# Patient Record
Sex: Female | Born: 1940
Health system: Southern US, Community
[De-identification: ages and names within clinical notes are randomized; demographics above are authoritative.]

## PROBLEM LIST (undated history)

## (undated) DIAGNOSIS — E785 Hyperlipidemia, unspecified: Secondary | ICD-10-CM

## (undated) DIAGNOSIS — K219 Gastro-esophageal reflux disease without esophagitis: Secondary | ICD-10-CM

## (undated) DIAGNOSIS — K449 Diaphragmatic hernia without obstruction or gangrene: Secondary | ICD-10-CM

## (undated) DIAGNOSIS — D869 Sarcoidosis, unspecified: Secondary | ICD-10-CM

## (undated) DIAGNOSIS — I10 Essential (primary) hypertension: Secondary | ICD-10-CM

## (undated) HISTORY — PX: PARTIAL HYSTERECTOMY: SHX80

## (undated) HISTORY — PX: CYST REMOVAL TRUNK: SHX6283

## (undated) HISTORY — DX: Diaphragmatic hernia without obstruction or gangrene: K44.9

## (undated) HISTORY — DX: Hyperlipidemia, unspecified: E78.5

## (undated) HISTORY — DX: Gastro-esophageal reflux disease without esophagitis: K21.9

## (undated) HISTORY — DX: Sarcoidosis, unspecified: D86.9

## (undated) HISTORY — DX: Essential (primary) hypertension: I10

---

## 2013-09-16 DIAGNOSIS — E785 Hyperlipidemia, unspecified: Secondary | ICD-10-CM | POA: Insufficient documentation

## 2014-03-29 ENCOUNTER — Other Ambulatory Visit (HOSPITAL_COMMUNITY): Payer: Self-pay | Admitting: "Endocrinology

## 2014-03-29 DIAGNOSIS — E041 Nontoxic single thyroid nodule: Secondary | ICD-10-CM

## 2014-04-06 ENCOUNTER — Other Ambulatory Visit (HOSPITAL_COMMUNITY): Payer: Self-pay | Admitting: "Endocrinology

## 2014-04-06 ENCOUNTER — Ambulatory Visit (HOSPITAL_COMMUNITY)
Admission: RE | Admit: 2014-04-06 | Discharge: 2014-04-06 | Disposition: A | Discharge status: Home / Self Care | Payer: Medicare Other | Source: Ambulatory Visit | Attending: "Endocrinology | Admitting: "Endocrinology

## 2014-04-06 ENCOUNTER — Encounter (HOSPITAL_COMMUNITY): Payer: Self-pay

## 2014-04-06 DIAGNOSIS — E049 Nontoxic goiter, unspecified: Secondary | ICD-10-CM | POA: Insufficient documentation

## 2014-04-06 DIAGNOSIS — E0590 Thyrotoxicosis, unspecified without thyrotoxic crisis or storm: Secondary | ICD-10-CM | POA: Insufficient documentation

## 2014-04-06 DIAGNOSIS — E041 Nontoxic single thyroid nodule: Secondary | ICD-10-CM | POA: Insufficient documentation

## 2014-04-06 MED ORDER — LIDOCAINE HCL (PF) 2 % IJ SOLN
INTRAMUSCULAR | Status: AC
  Filled 2014-04-06: qty 10

## 2014-04-06 NOTE — Discharge Instructions (Signed)
°Thyroid Biopsy °The thyroid gland is a butterfly-shaped gland situated in the front of the neck. It produces hormones which affect metabolism, growth and development, and body temperature. A thyroid biopsy is a procedure in which small samples of tissue or fluid are removed from the thyroid gland or mass and examined under a microscope. This test is done to determine the cause of thyroid problems, such as infection, cancer, or other thyroid problems. °There are 2 ways to obtain samples: °1. Fine needle biopsy. Samples are removed using a thin needle inserted through the skin and into the thyroid gland or mass. °2. Open biopsy. Samples are removed after a cut (incision) is made through the skin. °LET YOUR CAREGIVER KNOW ABOUT:  °· Allergies. °· Medications taken including herbs, eye drops, over-the-counter medications, and creams. °· Use of steroids (by mouth or creams). °· Previous problems with anesthetics or numbing medicine. °· Possibility of pregnancy, if this applies. °· History of blood clots (thrombophlebitis). °· History of bleeding or blood problems. °· Previous surgery. °· Other health problems. °RISKS AND COMPLICATIONS °· Bleeding from the site. The risk of bleeding is higher if you have a bleeding disorder or are taking any blood thinning medications (anticoagulants). °· Infection. °· Injury to structures near the thyroid gland. °BEFORE THE PROCEDURE  °This is a procedure that can be done as an outpatient. Confirm the time that you need to arrive for your procedure. Confirm whether there is a need to fast or withhold any medications. A blood sample may be done to determine your blood clotting time. Medicine may be given to help you relax (sedative). °PROCEDURE °Fine needle biopsy. °You will be awake during the procedure. You may be asked to lie on your back with your head tipped backward to extend your neck. Let your caregiver know if you cannot tolerate the positioning. An area on your neck will be  cleansed. A needle is inserted through the skin of your neck. You may feel a mild discomfort during this procedure. You may be asked to avoid coughing, talking, swallowing, or making sounds during some portions of the procedure. The needle is withdrawn once tissue or fluid samples have been removed. Pressure may be applied to the neck to reduce swelling and ensure that bleeding has stopped. The samples will be sent for examination.  °Open biopsy. °You will be given general anesthesia. You will be asleep during the procedure. An incision is made in your neck. A sample of thyroid tissue or the mass is removed. The tissue sample or mass will be sent for examination. The sample or mass may be examined during the biopsy. If the sample or mass contains cancer cells, some or all of the thyroid gland may be removed. The incision is closed with stitches. °AFTER THE PROCEDURE  °Your recovery will be assessed and monitored. If there are no problems, as an outpatient, you should be able to go home shortly after the procedure. °If you had a fine needle biopsy: °· You may have soreness at the biopsy site for 1 to 2 days. °If you had an open biopsy:  °· You may have soreness at the biopsy site for 3 to 4 days. °· You may have a hoarse voice or sore throat for 1 to 2 days. °Obtaining the Test Results °It is your responsibility to obtain your test results. Do not assume everything is normal if you have not heard from your caregiver or the medical facility. It is important for you to follow up   on all of your test results. °HOME CARE INSTRUCTIONS  °· Keeping your head raised on a pillow when you are lying down may ease biopsy site discomfort. °· Supporting the back of your head and neck with both hands as you sit up from a lying position may ease biopsy site discomfort. °· Only take over-the-counter or prescription medicines for pain, discomfort, or fever as directed by your caregiver. °· Throat lozenges or gargling with warm salt  water may help to soothe a sore throat. °SEEK IMMEDIATE MEDICAL CARE IF:  °· You have severe bleeding from the biopsy site. °· You have difficulty swallowing. °· You have a fever. °· You have increased pain, swelling, redness, or warmth at the biopsy site. °· You notice pus coming from the biopsy site. °· You have swollen glands (lymph nodes) in your neck. °Document Released: 05/11/2007 Document Revised: 11/08/2012 Document Reviewed: 10/06/2013 °ExitCare® Patient Information ©2015 [employer]. This information is not intended to replace advice given to you by your health care provider. Make sure you discuss any questions you have with your health care provider. ° °Thyroid Biopsy °The thyroid gland is a butterfly-shaped gland situated in the front of the neck. It produces hormones which affect metabolism, growth and development, and body temperature. A thyroid biopsy is a procedure in which small samples of tissue or fluid are removed from the thyroid gland or mass and examined under a microscope. This test is done to determine the cause of thyroid problems, such as infection, cancer, or other thyroid problems. °There are 2 ways to obtain samples: °3. Fine needle biopsy. Samples are removed using a thin needle inserted through the skin and into the thyroid gland or mass. °4. Open biopsy. Samples are removed after a cut (incision) is made through the skin. °LET YOUR CAREGIVER KNOW ABOUT:  °· Allergies. °· Medications taken including herbs, eye drops, over-the-counter medications, and creams. °· Use of steroids (by mouth or creams). °· Previous problems with anesthetics or numbing medicine. °· Possibility of pregnancy, if this applies. °· History of blood clots (thrombophlebitis). °· History of bleeding or blood problems. °· Previous surgery. °· Other health problems. °RISKS AND COMPLICATIONS °· Bleeding from the site. The risk of bleeding is higher if you have a bleeding disorder or are taking any blood thinning  medications (anticoagulants). °· Infection. °· Injury to structures near the thyroid gland. °BEFORE THE PROCEDURE  °This is a procedure that can be done as an outpatient. Confirm the time that you need to arrive for your procedure. Confirm whether there is a need to fast or withhold any medications. A blood sample may be done to determine your blood clotting time. Medicine may be given to help you relax (sedative). °PROCEDURE °Fine needle biopsy. °You will be awake during the procedure. You may be asked to lie on your back with your head tipped backward to extend your neck. Let your caregiver know if you cannot tolerate the positioning. An area on your neck will be cleansed. A needle is inserted through the skin of your neck. You may feel a mild discomfort during this procedure. You may be asked to avoid coughing, talking, swallowing, or making sounds during some portions of the procedure. The needle is withdrawn once tissue or fluid samples have been removed. Pressure may be applied to the neck to reduce swelling and ensure that bleeding has stopped. The samples will be sent for examination.  °Open biopsy. °You will be given general anesthesia. You will be asleep during   the procedure. An incision is made in your neck. A sample of thyroid tissue or the mass is removed. The tissue sample or mass will be sent for examination. The sample or mass may be examined during the biopsy. If the sample or mass contains cancer cells, some or all of the thyroid gland may be removed. The incision is closed with stitches. °AFTER THE PROCEDURE  °Your recovery will be assessed and monitored. If there are no problems, as an outpatient, you should be able to go home shortly after the procedure. °If you had a fine needle biopsy: °· You may have soreness at the biopsy site for 1 to 2 days. °If you had an open biopsy:  °· You may have soreness at the biopsy site for 3 to 4 days. °· You may have a hoarse voice or sore throat for 1 to 2  days. °Obtaining the Test Results °It is your responsibility to obtain your test results. Do not assume everything is normal if you have not heard from your caregiver or the medical facility. It is important for you to follow up on all of your test results. °HOME CARE INSTRUCTIONS  °· Keeping your head raised on a pillow when you are lying down may ease biopsy site discomfort. °· Supporting the back of your head and neck with both hands as you sit up from a lying position may ease biopsy site discomfort. °· Only take over-the-counter or prescription medicines for pain, discomfort, or fever as directed by your caregiver. °· Throat lozenges or gargling with warm salt water may help to soothe a sore throat. °SEEK IMMEDIATE MEDICAL CARE IF:  °· You have severe bleeding from the biopsy site. °· You have difficulty swallowing. °· You have a fever. °· You have increased pain, swelling, redness, or warmth at the biopsy site. °· You notice pus coming from the biopsy site. °· You have swollen glands (lymph nodes) in your neck. °Document Released: 05/11/2007 Document Revised: 11/08/2012 Document Reviewed: 10/06/2013 °ExitCare® Patient Information ©2015 [employer]. This information is not intended to replace advice given to you by your health care provider. Make sure you discuss any questions you have with your health care provider. ° °

## 2014-05-29 ENCOUNTER — Other Ambulatory Visit (HOSPITAL_COMMUNITY): Payer: Self-pay | Admitting: "Endocrinology

## 2014-05-29 DIAGNOSIS — E042 Nontoxic multinodular goiter: Secondary | ICD-10-CM

## 2014-06-26 ENCOUNTER — Ambulatory Visit (HOSPITAL_COMMUNITY): Payer: Medicare Other

## 2014-08-02 DIAGNOSIS — K219 Gastro-esophageal reflux disease without esophagitis: Secondary | ICD-10-CM | POA: Diagnosis not present

## 2014-08-02 DIAGNOSIS — E782 Mixed hyperlipidemia: Secondary | ICD-10-CM | POA: Diagnosis not present

## 2014-08-02 DIAGNOSIS — E039 Hypothyroidism, unspecified: Secondary | ICD-10-CM | POA: Diagnosis not present

## 2014-08-02 DIAGNOSIS — I10 Essential (primary) hypertension: Secondary | ICD-10-CM | POA: Diagnosis not present

## 2014-08-09 DIAGNOSIS — E041 Nontoxic single thyroid nodule: Secondary | ICD-10-CM | POA: Diagnosis not present

## 2014-08-09 DIAGNOSIS — G4733 Obstructive sleep apnea (adult) (pediatric): Secondary | ICD-10-CM | POA: Diagnosis not present

## 2014-08-09 DIAGNOSIS — Z1389 Encounter for screening for other disorder: Secondary | ICD-10-CM | POA: Diagnosis not present

## 2014-08-09 DIAGNOSIS — E0580 Other thyrotoxicosis without thyrotoxic crisis or storm: Secondary | ICD-10-CM | POA: Diagnosis not present

## 2014-08-09 DIAGNOSIS — E782 Mixed hyperlipidemia: Secondary | ICD-10-CM | POA: Diagnosis not present

## 2014-08-09 DIAGNOSIS — Z23 Encounter for immunization: Secondary | ICD-10-CM | POA: Diagnosis not present

## 2014-11-20 DIAGNOSIS — E041 Nontoxic single thyroid nodule: Secondary | ICD-10-CM | POA: Diagnosis not present

## 2014-11-20 DIAGNOSIS — E0580 Other thyrotoxicosis without thyrotoxic crisis or storm: Secondary | ICD-10-CM | POA: Diagnosis not present

## 2014-11-20 DIAGNOSIS — E039 Hypothyroidism, unspecified: Secondary | ICD-10-CM | POA: Diagnosis not present

## 2014-11-20 DIAGNOSIS — E782 Mixed hyperlipidemia: Secondary | ICD-10-CM | POA: Diagnosis not present

## 2014-11-20 DIAGNOSIS — E1165 Type 2 diabetes mellitus with hyperglycemia: Secondary | ICD-10-CM | POA: Diagnosis not present

## 2014-11-23 DIAGNOSIS — E0580 Other thyrotoxicosis without thyrotoxic crisis or storm: Secondary | ICD-10-CM | POA: Diagnosis not present

## 2014-11-23 DIAGNOSIS — E039 Hypothyroidism, unspecified: Secondary | ICD-10-CM | POA: Diagnosis not present

## 2014-11-23 DIAGNOSIS — I10 Essential (primary) hypertension: Secondary | ICD-10-CM | POA: Diagnosis not present

## 2014-11-23 DIAGNOSIS — E041 Nontoxic single thyroid nodule: Secondary | ICD-10-CM | POA: Diagnosis not present

## 2014-11-23 DIAGNOSIS — E1165 Type 2 diabetes mellitus with hyperglycemia: Secondary | ICD-10-CM | POA: Diagnosis not present

## 2015-02-05 DIAGNOSIS — E1165 Type 2 diabetes mellitus with hyperglycemia: Secondary | ICD-10-CM | POA: Diagnosis not present

## 2015-02-05 DIAGNOSIS — E039 Hypothyroidism, unspecified: Secondary | ICD-10-CM | POA: Diagnosis not present

## 2015-02-05 DIAGNOSIS — E0580 Other thyrotoxicosis without thyrotoxic crisis or storm: Secondary | ICD-10-CM | POA: Diagnosis not present

## 2015-02-05 DIAGNOSIS — E041 Nontoxic single thyroid nodule: Secondary | ICD-10-CM | POA: Diagnosis not present

## 2015-02-05 DIAGNOSIS — E782 Mixed hyperlipidemia: Secondary | ICD-10-CM | POA: Diagnosis not present

## 2015-02-12 DIAGNOSIS — E041 Nontoxic single thyroid nodule: Secondary | ICD-10-CM | POA: Diagnosis not present

## 2015-02-12 DIAGNOSIS — I10 Essential (primary) hypertension: Secondary | ICD-10-CM | POA: Diagnosis not present

## 2015-02-12 DIAGNOSIS — E1165 Type 2 diabetes mellitus with hyperglycemia: Secondary | ICD-10-CM | POA: Diagnosis not present

## 2015-02-12 DIAGNOSIS — Z1389 Encounter for screening for other disorder: Secondary | ICD-10-CM | POA: Diagnosis not present

## 2015-05-25 DIAGNOSIS — Z23 Encounter for immunization: Secondary | ICD-10-CM | POA: Diagnosis not present

## 2015-05-25 DIAGNOSIS — M7552 Bursitis of left shoulder: Secondary | ICD-10-CM | POA: Diagnosis not present

## 2015-05-25 DIAGNOSIS — M7502 Adhesive capsulitis of left shoulder: Secondary | ICD-10-CM | POA: Diagnosis not present

## 2015-07-26 DIAGNOSIS — E039 Hypothyroidism, unspecified: Secondary | ICD-10-CM | POA: Diagnosis not present

## 2015-07-26 DIAGNOSIS — E1165 Type 2 diabetes mellitus with hyperglycemia: Secondary | ICD-10-CM | POA: Diagnosis not present

## 2015-07-26 DIAGNOSIS — E782 Mixed hyperlipidemia: Secondary | ICD-10-CM | POA: Diagnosis not present

## 2015-07-26 DIAGNOSIS — I10 Essential (primary) hypertension: Secondary | ICD-10-CM | POA: Diagnosis not present

## 2015-07-26 DIAGNOSIS — E0580 Other thyrotoxicosis without thyrotoxic crisis or storm: Secondary | ICD-10-CM | POA: Diagnosis not present

## 2015-08-02 DIAGNOSIS — E039 Hypothyroidism, unspecified: Secondary | ICD-10-CM | POA: Diagnosis not present

## 2015-08-02 DIAGNOSIS — G4733 Obstructive sleep apnea (adult) (pediatric): Secondary | ICD-10-CM | POA: Diagnosis not present

## 2015-08-02 DIAGNOSIS — E1165 Type 2 diabetes mellitus with hyperglycemia: Secondary | ICD-10-CM | POA: Diagnosis not present

## 2015-08-02 DIAGNOSIS — I10 Essential (primary) hypertension: Secondary | ICD-10-CM | POA: Diagnosis not present

## 2015-08-02 DIAGNOSIS — Z0001 Encounter for general adult medical examination with abnormal findings: Secondary | ICD-10-CM | POA: Diagnosis not present

## 2015-08-02 DIAGNOSIS — J439 Emphysema, unspecified: Secondary | ICD-10-CM | POA: Diagnosis not present

## 2015-08-02 DIAGNOSIS — E041 Nontoxic single thyroid nodule: Secondary | ICD-10-CM | POA: Diagnosis not present

## 2015-08-02 DIAGNOSIS — M545 Low back pain: Secondary | ICD-10-CM | POA: Diagnosis not present

## 2015-08-02 DIAGNOSIS — K219 Gastro-esophageal reflux disease without esophagitis: Secondary | ICD-10-CM | POA: Diagnosis not present

## 2015-09-13 DIAGNOSIS — Z0100 Encounter for examination of eyes and vision without abnormal findings: Secondary | ICD-10-CM | POA: Diagnosis not present

## 2015-09-13 DIAGNOSIS — Z961 Presence of intraocular lens: Secondary | ICD-10-CM | POA: Diagnosis not present

## 2015-09-13 DIAGNOSIS — H524 Presbyopia: Secondary | ICD-10-CM | POA: Diagnosis not present

## 2015-09-13 DIAGNOSIS — H5210 Myopia, unspecified eye: Secondary | ICD-10-CM | POA: Diagnosis not present

## 2016-03-25 DIAGNOSIS — Z6836 Body mass index (BMI) 36.0-36.9, adult: Secondary | ICD-10-CM | POA: Diagnosis not present

## 2016-03-25 DIAGNOSIS — E782 Mixed hyperlipidemia: Secondary | ICD-10-CM | POA: Diagnosis not present

## 2016-03-25 DIAGNOSIS — E039 Hypothyroidism, unspecified: Secondary | ICD-10-CM | POA: Diagnosis not present

## 2016-03-25 DIAGNOSIS — M62838 Other muscle spasm: Secondary | ICD-10-CM | POA: Diagnosis not present

## 2016-03-25 DIAGNOSIS — E1165 Type 2 diabetes mellitus with hyperglycemia: Secondary | ICD-10-CM | POA: Diagnosis not present

## 2016-05-28 DIAGNOSIS — G2581 Restless legs syndrome: Secondary | ICD-10-CM | POA: Diagnosis not present

## 2016-05-28 DIAGNOSIS — Z6836 Body mass index (BMI) 36.0-36.9, adult: Secondary | ICD-10-CM | POA: Diagnosis not present

## 2016-05-28 DIAGNOSIS — M609 Myositis, unspecified: Secondary | ICD-10-CM | POA: Diagnosis not present

## 2016-05-28 DIAGNOSIS — Z23 Encounter for immunization: Secondary | ICD-10-CM | POA: Diagnosis not present

## 2016-07-01 DIAGNOSIS — R300 Dysuria: Secondary | ICD-10-CM | POA: Diagnosis not present

## 2016-07-01 DIAGNOSIS — B373 Candidiasis of vulva and vagina: Secondary | ICD-10-CM | POA: Diagnosis not present

## 2016-07-01 DIAGNOSIS — M609 Myositis, unspecified: Secondary | ICD-10-CM | POA: Diagnosis not present

## 2016-07-01 DIAGNOSIS — Z6837 Body mass index (BMI) 37.0-37.9, adult: Secondary | ICD-10-CM | POA: Diagnosis not present

## 2016-08-18 DIAGNOSIS — D869 Sarcoidosis, unspecified: Secondary | ICD-10-CM | POA: Diagnosis not present

## 2016-08-18 DIAGNOSIS — M7552 Bursitis of left shoulder: Secondary | ICD-10-CM | POA: Diagnosis not present

## 2016-08-18 DIAGNOSIS — E041 Nontoxic single thyroid nodule: Secondary | ICD-10-CM | POA: Diagnosis not present

## 2016-08-18 DIAGNOSIS — E039 Hypothyroidism, unspecified: Secondary | ICD-10-CM | POA: Diagnosis not present

## 2016-08-18 DIAGNOSIS — M545 Low back pain: Secondary | ICD-10-CM | POA: Diagnosis not present

## 2016-08-18 DIAGNOSIS — M7502 Adhesive capsulitis of left shoulder: Secondary | ICD-10-CM | POA: Diagnosis not present

## 2016-08-18 DIAGNOSIS — D8686 Sarcoid arthropathy: Secondary | ICD-10-CM | POA: Diagnosis not present

## 2016-08-18 DIAGNOSIS — G2581 Restless legs syndrome: Secondary | ICD-10-CM | POA: Diagnosis not present

## 2016-08-18 DIAGNOSIS — E1165 Type 2 diabetes mellitus with hyperglycemia: Secondary | ICD-10-CM | POA: Diagnosis not present

## 2016-08-18 DIAGNOSIS — M609 Myositis, unspecified: Secondary | ICD-10-CM | POA: Diagnosis not present

## 2016-08-21 DIAGNOSIS — K449 Diaphragmatic hernia without obstruction or gangrene: Secondary | ICD-10-CM | POA: Diagnosis not present

## 2016-08-21 DIAGNOSIS — D7389 Other diseases of spleen: Secondary | ICD-10-CM | POA: Diagnosis not present

## 2016-08-21 DIAGNOSIS — J9811 Atelectasis: Secondary | ICD-10-CM | POA: Diagnosis not present

## 2016-08-21 DIAGNOSIS — D869 Sarcoidosis, unspecified: Secondary | ICD-10-CM | POA: Diagnosis not present

## 2016-08-21 DIAGNOSIS — E042 Nontoxic multinodular goiter: Secondary | ICD-10-CM | POA: Diagnosis not present

## 2016-08-21 DIAGNOSIS — R609 Edema, unspecified: Secondary | ICD-10-CM | POA: Diagnosis not present

## 2016-08-25 DIAGNOSIS — R0602 Shortness of breath: Secondary | ICD-10-CM | POA: Diagnosis not present

## 2016-08-25 DIAGNOSIS — D869 Sarcoidosis, unspecified: Secondary | ICD-10-CM | POA: Diagnosis not present

## 2016-08-25 DIAGNOSIS — E042 Nontoxic multinodular goiter: Secondary | ICD-10-CM | POA: Diagnosis not present

## 2016-08-25 DIAGNOSIS — R609 Edema, unspecified: Secondary | ICD-10-CM | POA: Diagnosis not present

## 2016-08-28 DIAGNOSIS — M25512 Pain in left shoulder: Secondary | ICD-10-CM | POA: Diagnosis not present

## 2016-08-28 DIAGNOSIS — M7552 Bursitis of left shoulder: Secondary | ICD-10-CM | POA: Diagnosis not present

## 2016-09-01 DIAGNOSIS — M25512 Pain in left shoulder: Secondary | ICD-10-CM | POA: Diagnosis not present

## 2016-09-01 DIAGNOSIS — M7552 Bursitis of left shoulder: Secondary | ICD-10-CM | POA: Diagnosis not present

## 2016-09-03 DIAGNOSIS — M25512 Pain in left shoulder: Secondary | ICD-10-CM | POA: Diagnosis not present

## 2016-09-03 DIAGNOSIS — M7552 Bursitis of left shoulder: Secondary | ICD-10-CM | POA: Diagnosis not present

## 2017-04-14 DIAGNOSIS — H1033 Unspecified acute conjunctivitis, bilateral: Secondary | ICD-10-CM | POA: Diagnosis not present

## 2017-04-22 DIAGNOSIS — H11423 Conjunctival edema, bilateral: Secondary | ICD-10-CM | POA: Diagnosis not present

## 2017-04-22 DIAGNOSIS — H1033 Unspecified acute conjunctivitis, bilateral: Secondary | ICD-10-CM | POA: Diagnosis not present

## 2017-04-22 DIAGNOSIS — H5201 Hypermetropia, right eye: Secondary | ICD-10-CM | POA: Diagnosis not present

## 2017-04-22 DIAGNOSIS — H52223 Regular astigmatism, bilateral: Secondary | ICD-10-CM | POA: Diagnosis not present

## 2017-04-22 DIAGNOSIS — H5212 Myopia, left eye: Secondary | ICD-10-CM | POA: Diagnosis not present

## 2017-05-21 DIAGNOSIS — J9622 Acute and chronic respiratory failure with hypercapnia: Secondary | ICD-10-CM | POA: Diagnosis not present

## 2017-05-21 DIAGNOSIS — K449 Diaphragmatic hernia without obstruction or gangrene: Secondary | ICD-10-CM | POA: Diagnosis not present

## 2017-05-21 DIAGNOSIS — Z23 Encounter for immunization: Secondary | ICD-10-CM | POA: Diagnosis not present

## 2017-05-21 DIAGNOSIS — D869 Sarcoidosis, unspecified: Secondary | ICD-10-CM | POA: Diagnosis not present

## 2017-05-21 DIAGNOSIS — I272 Pulmonary hypertension, unspecified: Secondary | ICD-10-CM | POA: Diagnosis not present

## 2017-05-21 DIAGNOSIS — Z6836 Body mass index (BMI) 36.0-36.9, adult: Secondary | ICD-10-CM | POA: Diagnosis not present

## 2017-05-21 DIAGNOSIS — Z6837 Body mass index (BMI) 37.0-37.9, adult: Secondary | ICD-10-CM | POA: Diagnosis not present

## 2017-05-21 DIAGNOSIS — I313 Pericardial effusion (noninflammatory): Secondary | ICD-10-CM | POA: Diagnosis not present

## 2017-05-21 DIAGNOSIS — R0902 Hypoxemia: Secondary | ICD-10-CM | POA: Diagnosis not present

## 2017-05-21 DIAGNOSIS — R069 Unspecified abnormalities of breathing: Secondary | ICD-10-CM | POA: Diagnosis not present

## 2017-05-21 DIAGNOSIS — I10 Essential (primary) hypertension: Secondary | ICD-10-CM | POA: Diagnosis not present

## 2017-05-21 DIAGNOSIS — J9601 Acute respiratory failure with hypoxia: Secondary | ICD-10-CM | POA: Diagnosis not present

## 2017-05-21 DIAGNOSIS — E119 Type 2 diabetes mellitus without complications: Secondary | ICD-10-CM | POA: Diagnosis not present

## 2017-05-21 DIAGNOSIS — D860 Sarcoidosis of lung: Secondary | ICD-10-CM | POA: Diagnosis not present

## 2017-05-21 DIAGNOSIS — R0602 Shortness of breath: Secondary | ICD-10-CM | POA: Diagnosis not present

## 2017-05-25 DIAGNOSIS — Z23 Encounter for immunization: Secondary | ICD-10-CM | POA: Diagnosis not present

## 2017-05-26 DIAGNOSIS — K449 Diaphragmatic hernia without obstruction or gangrene: Secondary | ICD-10-CM | POA: Diagnosis not present

## 2017-05-26 DIAGNOSIS — E041 Nontoxic single thyroid nodule: Secondary | ICD-10-CM | POA: Diagnosis not present

## 2017-05-26 DIAGNOSIS — Z9981 Dependence on supplemental oxygen: Secondary | ICD-10-CM | POA: Diagnosis not present

## 2017-05-26 DIAGNOSIS — J9622 Acute and chronic respiratory failure with hypercapnia: Secondary | ICD-10-CM | POA: Diagnosis not present

## 2017-05-26 DIAGNOSIS — E119 Type 2 diabetes mellitus without complications: Secondary | ICD-10-CM | POA: Diagnosis not present

## 2017-05-26 DIAGNOSIS — Z6836 Body mass index (BMI) 36.0-36.9, adult: Secondary | ICD-10-CM | POA: Diagnosis not present

## 2017-05-26 DIAGNOSIS — D860 Sarcoidosis of lung: Secondary | ICD-10-CM | POA: Diagnosis not present

## 2017-05-26 DIAGNOSIS — I10 Essential (primary) hypertension: Secondary | ICD-10-CM | POA: Diagnosis not present

## 2017-05-26 DIAGNOSIS — E662 Morbid (severe) obesity with alveolar hypoventilation: Secondary | ICD-10-CM | POA: Diagnosis not present

## 2017-05-29 DIAGNOSIS — E041 Nontoxic single thyroid nodule: Secondary | ICD-10-CM | POA: Diagnosis not present

## 2017-05-29 DIAGNOSIS — J9622 Acute and chronic respiratory failure with hypercapnia: Secondary | ICD-10-CM | POA: Diagnosis not present

## 2017-05-29 DIAGNOSIS — E119 Type 2 diabetes mellitus without complications: Secondary | ICD-10-CM | POA: Diagnosis not present

## 2017-05-29 DIAGNOSIS — I10 Essential (primary) hypertension: Secondary | ICD-10-CM | POA: Diagnosis not present

## 2017-05-29 DIAGNOSIS — Z9981 Dependence on supplemental oxygen: Secondary | ICD-10-CM | POA: Diagnosis not present

## 2017-05-29 DIAGNOSIS — D860 Sarcoidosis of lung: Secondary | ICD-10-CM | POA: Diagnosis not present

## 2017-05-29 DIAGNOSIS — E662 Morbid (severe) obesity with alveolar hypoventilation: Secondary | ICD-10-CM | POA: Diagnosis not present

## 2017-05-29 DIAGNOSIS — K449 Diaphragmatic hernia without obstruction or gangrene: Secondary | ICD-10-CM | POA: Diagnosis not present

## 2017-05-29 DIAGNOSIS — Z6836 Body mass index (BMI) 36.0-36.9, adult: Secondary | ICD-10-CM | POA: Diagnosis not present

## 2017-06-01 DIAGNOSIS — Z6836 Body mass index (BMI) 36.0-36.9, adult: Secondary | ICD-10-CM | POA: Diagnosis not present

## 2017-06-01 DIAGNOSIS — Z9981 Dependence on supplemental oxygen: Secondary | ICD-10-CM | POA: Diagnosis not present

## 2017-06-01 DIAGNOSIS — E041 Nontoxic single thyroid nodule: Secondary | ICD-10-CM | POA: Diagnosis not present

## 2017-06-01 DIAGNOSIS — I10 Essential (primary) hypertension: Secondary | ICD-10-CM | POA: Diagnosis not present

## 2017-06-01 DIAGNOSIS — D860 Sarcoidosis of lung: Secondary | ICD-10-CM | POA: Diagnosis not present

## 2017-06-01 DIAGNOSIS — E662 Morbid (severe) obesity with alveolar hypoventilation: Secondary | ICD-10-CM | POA: Diagnosis not present

## 2017-06-01 DIAGNOSIS — E119 Type 2 diabetes mellitus without complications: Secondary | ICD-10-CM | POA: Diagnosis not present

## 2017-06-01 DIAGNOSIS — J9622 Acute and chronic respiratory failure with hypercapnia: Secondary | ICD-10-CM | POA: Diagnosis not present

## 2017-06-01 DIAGNOSIS — K449 Diaphragmatic hernia without obstruction or gangrene: Secondary | ICD-10-CM | POA: Diagnosis not present

## 2017-06-02 DIAGNOSIS — Z9981 Dependence on supplemental oxygen: Secondary | ICD-10-CM | POA: Diagnosis not present

## 2017-06-02 DIAGNOSIS — J9622 Acute and chronic respiratory failure with hypercapnia: Secondary | ICD-10-CM | POA: Diagnosis not present

## 2017-06-02 DIAGNOSIS — E041 Nontoxic single thyroid nodule: Secondary | ICD-10-CM | POA: Diagnosis not present

## 2017-06-02 DIAGNOSIS — Z6836 Body mass index (BMI) 36.0-36.9, adult: Secondary | ICD-10-CM | POA: Diagnosis not present

## 2017-06-02 DIAGNOSIS — I10 Essential (primary) hypertension: Secondary | ICD-10-CM | POA: Diagnosis not present

## 2017-06-02 DIAGNOSIS — K449 Diaphragmatic hernia without obstruction or gangrene: Secondary | ICD-10-CM | POA: Diagnosis not present

## 2017-06-02 DIAGNOSIS — E662 Morbid (severe) obesity with alveolar hypoventilation: Secondary | ICD-10-CM | POA: Diagnosis not present

## 2017-06-02 DIAGNOSIS — D860 Sarcoidosis of lung: Secondary | ICD-10-CM | POA: Diagnosis not present

## 2017-06-02 DIAGNOSIS — E119 Type 2 diabetes mellitus without complications: Secondary | ICD-10-CM | POA: Diagnosis not present

## 2017-06-03 DIAGNOSIS — E041 Nontoxic single thyroid nodule: Secondary | ICD-10-CM | POA: Diagnosis not present

## 2017-06-03 DIAGNOSIS — E662 Morbid (severe) obesity with alveolar hypoventilation: Secondary | ICD-10-CM | POA: Diagnosis not present

## 2017-06-03 DIAGNOSIS — J9622 Acute and chronic respiratory failure with hypercapnia: Secondary | ICD-10-CM | POA: Diagnosis not present

## 2017-06-03 DIAGNOSIS — Z9981 Dependence on supplemental oxygen: Secondary | ICD-10-CM | POA: Diagnosis not present

## 2017-06-03 DIAGNOSIS — Z6836 Body mass index (BMI) 36.0-36.9, adult: Secondary | ICD-10-CM | POA: Diagnosis not present

## 2017-06-03 DIAGNOSIS — E119 Type 2 diabetes mellitus without complications: Secondary | ICD-10-CM | POA: Diagnosis not present

## 2017-06-03 DIAGNOSIS — K449 Diaphragmatic hernia without obstruction or gangrene: Secondary | ICD-10-CM | POA: Diagnosis not present

## 2017-06-03 DIAGNOSIS — D860 Sarcoidosis of lung: Secondary | ICD-10-CM | POA: Diagnosis not present

## 2017-06-03 DIAGNOSIS — I10 Essential (primary) hypertension: Secondary | ICD-10-CM | POA: Diagnosis not present

## 2017-06-04 DIAGNOSIS — E041 Nontoxic single thyroid nodule: Secondary | ICD-10-CM | POA: Diagnosis not present

## 2017-06-04 DIAGNOSIS — I10 Essential (primary) hypertension: Secondary | ICD-10-CM | POA: Diagnosis not present

## 2017-06-04 DIAGNOSIS — Z6836 Body mass index (BMI) 36.0-36.9, adult: Secondary | ICD-10-CM | POA: Diagnosis not present

## 2017-06-04 DIAGNOSIS — K449 Diaphragmatic hernia without obstruction or gangrene: Secondary | ICD-10-CM | POA: Diagnosis not present

## 2017-06-04 DIAGNOSIS — Z9981 Dependence on supplemental oxygen: Secondary | ICD-10-CM | POA: Diagnosis not present

## 2017-06-04 DIAGNOSIS — D860 Sarcoidosis of lung: Secondary | ICD-10-CM | POA: Diagnosis not present

## 2017-06-04 DIAGNOSIS — J9622 Acute and chronic respiratory failure with hypercapnia: Secondary | ICD-10-CM | POA: Diagnosis not present

## 2017-06-04 DIAGNOSIS — E119 Type 2 diabetes mellitus without complications: Secondary | ICD-10-CM | POA: Diagnosis not present

## 2017-06-04 DIAGNOSIS — E662 Morbid (severe) obesity with alveolar hypoventilation: Secondary | ICD-10-CM | POA: Diagnosis not present

## 2017-06-10 DIAGNOSIS — D860 Sarcoidosis of lung: Secondary | ICD-10-CM | POA: Diagnosis not present

## 2017-06-10 DIAGNOSIS — E041 Nontoxic single thyroid nodule: Secondary | ICD-10-CM | POA: Diagnosis not present

## 2017-06-10 DIAGNOSIS — I10 Essential (primary) hypertension: Secondary | ICD-10-CM | POA: Diagnosis not present

## 2017-06-10 DIAGNOSIS — E662 Morbid (severe) obesity with alveolar hypoventilation: Secondary | ICD-10-CM | POA: Diagnosis not present

## 2017-06-10 DIAGNOSIS — Z6836 Body mass index (BMI) 36.0-36.9, adult: Secondary | ICD-10-CM | POA: Diagnosis not present

## 2017-06-10 DIAGNOSIS — Z9981 Dependence on supplemental oxygen: Secondary | ICD-10-CM | POA: Diagnosis not present

## 2017-06-10 DIAGNOSIS — K449 Diaphragmatic hernia without obstruction or gangrene: Secondary | ICD-10-CM | POA: Diagnosis not present

## 2017-06-10 DIAGNOSIS — J9622 Acute and chronic respiratory failure with hypercapnia: Secondary | ICD-10-CM | POA: Diagnosis not present

## 2017-06-10 DIAGNOSIS — E119 Type 2 diabetes mellitus without complications: Secondary | ICD-10-CM | POA: Diagnosis not present

## 2017-06-11 ENCOUNTER — Ambulatory Visit (INDEPENDENT_AMBULATORY_CARE_PROVIDER_SITE_OTHER): Payer: Medicare HMO | Admitting: Pulmonary Disease

## 2017-06-11 ENCOUNTER — Encounter: Payer: Self-pay | Admitting: Pulmonary Disease

## 2017-06-11 VITALS — BP 120/74 | HR 82 | Ht 65.5 in | Wt 214.0 lb

## 2017-06-11 DIAGNOSIS — J9611 Chronic respiratory failure with hypoxia: Secondary | ICD-10-CM

## 2017-06-11 DIAGNOSIS — I313 Pericardial effusion (noninflammatory): Secondary | ICD-10-CM | POA: Insufficient documentation

## 2017-06-11 DIAGNOSIS — I3139 Other pericardial effusion (noninflammatory): Secondary | ICD-10-CM

## 2017-06-11 DIAGNOSIS — J9612 Chronic respiratory failure with hypercapnia: Secondary | ICD-10-CM | POA: Diagnosis not present

## 2017-06-11 DIAGNOSIS — G4733 Obstructive sleep apnea (adult) (pediatric): Secondary | ICD-10-CM

## 2017-06-11 DIAGNOSIS — I10 Essential (primary) hypertension: Secondary | ICD-10-CM | POA: Diagnosis not present

## 2017-06-11 DIAGNOSIS — E662 Morbid (severe) obesity with alveolar hypoventilation: Secondary | ICD-10-CM | POA: Diagnosis not present

## 2017-06-11 DIAGNOSIS — R269 Unspecified abnormalities of gait and mobility: Secondary | ICD-10-CM | POA: Diagnosis not present

## 2017-06-11 DIAGNOSIS — I2729 Other secondary pulmonary hypertension: Secondary | ICD-10-CM

## 2017-06-11 DIAGNOSIS — Z9981 Dependence on supplemental oxygen: Secondary | ICD-10-CM | POA: Diagnosis not present

## 2017-06-11 DIAGNOSIS — J9622 Acute and chronic respiratory failure with hypercapnia: Secondary | ICD-10-CM | POA: Diagnosis not present

## 2017-06-11 DIAGNOSIS — R0602 Shortness of breath: Secondary | ICD-10-CM | POA: Diagnosis not present

## 2017-06-11 DIAGNOSIS — E041 Nontoxic single thyroid nodule: Secondary | ICD-10-CM | POA: Diagnosis not present

## 2017-06-11 DIAGNOSIS — E119 Type 2 diabetes mellitus without complications: Secondary | ICD-10-CM | POA: Diagnosis not present

## 2017-06-11 DIAGNOSIS — D860 Sarcoidosis of lung: Secondary | ICD-10-CM | POA: Diagnosis not present

## 2017-06-11 DIAGNOSIS — Z6836 Body mass index (BMI) 36.0-36.9, adult: Secondary | ICD-10-CM | POA: Diagnosis not present

## 2017-06-11 DIAGNOSIS — K449 Diaphragmatic hernia without obstruction or gangrene: Secondary | ICD-10-CM | POA: Diagnosis not present

## 2017-06-11 HISTORY — DX: Other secondary pulmonary hypertension: I27.29

## 2017-06-11 HISTORY — DX: Other pericardial effusion (noninflammatory): I31.39

## 2017-06-11 HISTORY — DX: Chronic respiratory failure with hypoxia: J96.11

## 2017-06-11 NOTE — Progress Notes (Signed)
Subjective:    Patient ID: Shelley Gonzalez, female    DOB: 03/01/1941, 76 y.o.   MRN: [id number]  HPI  76 year old never smoker presents to establish care after [facility] hypoxic and hypercarbic respiratory failure.  She is accompanied by her daughter and son-in-law today and arrives on 2 L oxygen with saturation of 96%, 89% off of oxygen  She was hospitalized on 10/26 after a routine visit to PCP office where she was found to be hypoxic to 70% on room air. ABG was 7.32/80 7/50 on 2 L oxygen. She was treated with oxygen and BiPAP and on 10/20 8 repeat ABG was 7.3 04/21/41 on 2 L of oxygen.  She was dilated for discharge on BiPAP and 2 L of oxygen unfortunately facemask was damaged and she has not used BiPAP since discharge  She reports a diagnosis of sarcoidosis which is made more than 10 years ago, I reviewed cardiology visit from [facility] from 2015 and it makes a mention of biopsy diagnosed sarcoidosis however patient denies having ever had any kind of biopsy.  She has never required oxygen in the past.  She reports gradually progressive dyspnea over many years, daughter reports increased sleepiness and more sedentary over the days and weeks leading to admission.  She was  prescribed albuterol nebs about 2 years ago  Epworth sleepiness score is 5 and she reports increased sleepiness over the days leading to hospital admission. Loud snoring is been noted by daughter and other family members. She denies gasping or choking episodes, she sleeps with 2 pillows. Bedtime is between 9 and 10 PM, sleep latency can be up to an hour, reports 1-2 bathroom visits including nocturia and is out of bed by 8 mm feeling tired with occasional dryness of mouth but denies a headache  She denies any weakness of extremities  Significant tests/ events reviewed  CT chest 04/2017 was negative for pulmonary embolism, showed massive hiatal hernia on the left, small pleural  effusion. Echo showed normal LV function with moderate pulmonary hypertension at 52 and moderate circumferential pericardial effusion without tamponade physiology  Past medical history-hypertension, diabetes, stroke with no deficits. Past surgical history hysterectomy 1980  She is divorced and lives with her son, she is retired from a [employer] female on her life.  Her husband  smoked 2 packs/day, she is divorced  History reviewed. No pertinent past medical history.   Review of Systems Positive for shortness of breath with activity and rest, S and husband, loss of appetite, abdominal pain.   Constitutional: negative for anorexia, fevers and sweats  Eyes: negative for irritation, redness and visual disturbance  Ears, nose, mouth, throat, and face: negative for earaches, epistaxis, nasal congestion and sore throat  Respiratory: negative for cough,sputum and wheezing  Cardiovascular: negative for chest pain, dyspnea, lower extremity edema, orthopnea, palpitations and syncope  Gastrointestinal: negative for abdominal pain, constipation, diarrhea, melena, nausea and vomiting  Genitourinary:negative for dysuria, frequency and hematuria  Hematologic/lymphatic: negative for bleeding, easy bruising and lymphadenopathy  Musculoskeletal:negative for arthralgias, muscle weakness and stiff joints  Neurological: negative for coordination problems, gait problems, headaches and weakness  Endocrine: negative for diabetic symptoms including polydipsia, polyuria and weight loss      Objective:   Physical Exam  Gen. Pleasant, obese, in no distress, normal affect ENT - no lesions, no post nasal drip, class 2-3 airway Neck: No JVD, no thyromegaly, no carotid bruits Lungs: no use of accessory muscles, no dullness  to percussion, decreased without rales or rhonchi  Cardiovascular: Rhythm regular, heart sounds  normal, no murmurs or gallops, no peripheral edema Abdomen: soft and non-tender, no  hepatosplenomegaly, BS normal. Musculoskeletal: No deformities, no cyanosis or clubbing Neuro:  alert, non focal, no tremors        Assessment & Plan:

## 2017-06-11 NOTE — Patient Instructions (Signed)
Schedule PFTs Schedule sleep study.  Referral [facility]  I will review your CT scan Continue using oxygen for now. Really important that you use your BiPAP at least 6 hours every night Obtain full facemask, [facility]

## 2017-06-11 NOTE — Assessment & Plan Note (Signed)
Repeat echo in 1 month to assess her sooner if dyspnea worsens

## 2017-06-11 NOTE — Assessment & Plan Note (Signed)
Unclear cause, could be obesity hypoventilation or related to large hiatal hernia causing restrictive lung disease.  No evidence of neuromuscular cause Will obtain TSH if not already done by PCP  Schedule PFTs Schedule sleep study.  [facility].

## 2017-06-11 NOTE — Assessment & Plan Note (Signed)
Likely secondary to hypoxia. We will need repeat echo in February to assess

## 2017-06-12 DIAGNOSIS — Z6836 Body mass index (BMI) 36.0-36.9, adult: Secondary | ICD-10-CM | POA: Diagnosis not present

## 2017-06-12 DIAGNOSIS — I10 Essential (primary) hypertension: Secondary | ICD-10-CM | POA: Diagnosis not present

## 2017-06-12 DIAGNOSIS — E041 Nontoxic single thyroid nodule: Secondary | ICD-10-CM | POA: Diagnosis not present

## 2017-06-12 DIAGNOSIS — E662 Morbid (severe) obesity with alveolar hypoventilation: Secondary | ICD-10-CM | POA: Diagnosis not present

## 2017-06-12 DIAGNOSIS — K449 Diaphragmatic hernia without obstruction or gangrene: Secondary | ICD-10-CM | POA: Diagnosis not present

## 2017-06-12 DIAGNOSIS — Z9981 Dependence on supplemental oxygen: Secondary | ICD-10-CM | POA: Diagnosis not present

## 2017-06-12 DIAGNOSIS — E119 Type 2 diabetes mellitus without complications: Secondary | ICD-10-CM | POA: Diagnosis not present

## 2017-06-12 DIAGNOSIS — J9622 Acute and chronic respiratory failure with hypercapnia: Secondary | ICD-10-CM | POA: Diagnosis not present

## 2017-06-12 DIAGNOSIS — D860 Sarcoidosis of lung: Secondary | ICD-10-CM | POA: Diagnosis not present

## 2017-06-16 ENCOUNTER — Telehealth: Payer: Self-pay | Admitting: Pulmonary Disease

## 2017-06-16 DIAGNOSIS — E041 Nontoxic single thyroid nodule: Secondary | ICD-10-CM | POA: Diagnosis not present

## 2017-06-16 DIAGNOSIS — J9622 Acute and chronic respiratory failure with hypercapnia: Secondary | ICD-10-CM | POA: Diagnosis not present

## 2017-06-16 DIAGNOSIS — E119 Type 2 diabetes mellitus without complications: Secondary | ICD-10-CM | POA: Diagnosis not present

## 2017-06-16 DIAGNOSIS — E662 Morbid (severe) obesity with alveolar hypoventilation: Secondary | ICD-10-CM | POA: Diagnosis not present

## 2017-06-16 DIAGNOSIS — Z9981 Dependence on supplemental oxygen: Secondary | ICD-10-CM | POA: Diagnosis not present

## 2017-06-16 DIAGNOSIS — D860 Sarcoidosis of lung: Secondary | ICD-10-CM | POA: Diagnosis not present

## 2017-06-16 DIAGNOSIS — K449 Diaphragmatic hernia without obstruction or gangrene: Secondary | ICD-10-CM | POA: Diagnosis not present

## 2017-06-16 DIAGNOSIS — I10 Essential (primary) hypertension: Secondary | ICD-10-CM | POA: Diagnosis not present

## 2017-06-16 DIAGNOSIS — I313 Pericardial effusion (noninflammatory): Secondary | ICD-10-CM

## 2017-06-16 DIAGNOSIS — Z6836 Body mass index (BMI) 36.0-36.9, adult: Secondary | ICD-10-CM | POA: Diagnosis not present

## 2017-06-16 DIAGNOSIS — I3139 Other pericardial effusion (noninflammatory): Secondary | ICD-10-CM

## 2017-06-16 NOTE — Telephone Encounter (Signed)
I have reviewed her records [facility] including [facility] Chest x-ray shows elevated left hemidiaphragm  Recommend -Sniff test to check for paralysis of diaphragm muscle -Repeat echo to check for fluid around the heart/pericardial effusion

## 2017-06-19 DIAGNOSIS — E041 Nontoxic single thyroid nodule: Secondary | ICD-10-CM | POA: Diagnosis not present

## 2017-06-19 DIAGNOSIS — I10 Essential (primary) hypertension: Secondary | ICD-10-CM | POA: Diagnosis not present

## 2017-06-19 DIAGNOSIS — Z6836 Body mass index (BMI) 36.0-36.9, adult: Secondary | ICD-10-CM | POA: Diagnosis not present

## 2017-06-19 DIAGNOSIS — K449 Diaphragmatic hernia without obstruction or gangrene: Secondary | ICD-10-CM | POA: Diagnosis not present

## 2017-06-19 DIAGNOSIS — E662 Morbid (severe) obesity with alveolar hypoventilation: Secondary | ICD-10-CM | POA: Diagnosis not present

## 2017-06-19 DIAGNOSIS — J9622 Acute and chronic respiratory failure with hypercapnia: Secondary | ICD-10-CM | POA: Diagnosis not present

## 2017-06-19 DIAGNOSIS — D860 Sarcoidosis of lung: Secondary | ICD-10-CM | POA: Diagnosis not present

## 2017-06-19 DIAGNOSIS — E119 Type 2 diabetes mellitus without complications: Secondary | ICD-10-CM | POA: Diagnosis not present

## 2017-06-19 DIAGNOSIS — Z9981 Dependence on supplemental oxygen: Secondary | ICD-10-CM | POA: Diagnosis not present

## 2017-06-19 NOTE — Telephone Encounter (Signed)
Called and spoke with patient today regarding results per ra.  Informed the patient of her results today and recommendations per ra. The patient verbalized understanding and denied any questions or concerns at this time. Placed order for sniff test to check for paralysis of diaphragm muscles, and repeat echo to check fluid around heart effusion. Nothing further needed.

## 2017-06-23 DIAGNOSIS — E119 Type 2 diabetes mellitus without complications: Secondary | ICD-10-CM | POA: Diagnosis not present

## 2017-06-23 DIAGNOSIS — K449 Diaphragmatic hernia without obstruction or gangrene: Secondary | ICD-10-CM | POA: Diagnosis not present

## 2017-06-23 DIAGNOSIS — I10 Essential (primary) hypertension: Secondary | ICD-10-CM | POA: Diagnosis not present

## 2017-06-23 DIAGNOSIS — J9622 Acute and chronic respiratory failure with hypercapnia: Secondary | ICD-10-CM | POA: Diagnosis not present

## 2017-06-23 DIAGNOSIS — D860 Sarcoidosis of lung: Secondary | ICD-10-CM | POA: Diagnosis not present

## 2017-06-23 DIAGNOSIS — E662 Morbid (severe) obesity with alveolar hypoventilation: Secondary | ICD-10-CM | POA: Diagnosis not present

## 2017-06-23 DIAGNOSIS — E041 Nontoxic single thyroid nodule: Secondary | ICD-10-CM | POA: Diagnosis not present

## 2017-06-23 DIAGNOSIS — Z9981 Dependence on supplemental oxygen: Secondary | ICD-10-CM | POA: Diagnosis not present

## 2017-06-23 DIAGNOSIS — Z6836 Body mass index (BMI) 36.0-36.9, adult: Secondary | ICD-10-CM | POA: Diagnosis not present

## 2017-06-25 DIAGNOSIS — E662 Morbid (severe) obesity with alveolar hypoventilation: Secondary | ICD-10-CM | POA: Diagnosis not present

## 2017-06-25 DIAGNOSIS — D860 Sarcoidosis of lung: Secondary | ICD-10-CM | POA: Diagnosis not present

## 2017-06-25 DIAGNOSIS — K449 Diaphragmatic hernia without obstruction or gangrene: Secondary | ICD-10-CM | POA: Diagnosis not present

## 2017-06-25 DIAGNOSIS — I10 Essential (primary) hypertension: Secondary | ICD-10-CM | POA: Diagnosis not present

## 2017-06-25 DIAGNOSIS — E041 Nontoxic single thyroid nodule: Secondary | ICD-10-CM | POA: Diagnosis not present

## 2017-06-25 DIAGNOSIS — Z6836 Body mass index (BMI) 36.0-36.9, adult: Secondary | ICD-10-CM | POA: Diagnosis not present

## 2017-06-25 DIAGNOSIS — J9622 Acute and chronic respiratory failure with hypercapnia: Secondary | ICD-10-CM | POA: Diagnosis not present

## 2017-06-25 DIAGNOSIS — Z9981 Dependence on supplemental oxygen: Secondary | ICD-10-CM | POA: Diagnosis not present

## 2017-06-25 DIAGNOSIS — E119 Type 2 diabetes mellitus without complications: Secondary | ICD-10-CM | POA: Diagnosis not present

## 2017-06-26 ENCOUNTER — Ambulatory Visit (HOSPITAL_COMMUNITY)
Admission: RE | Admit: 2017-06-26 | Discharge: 2017-06-26 | Disposition: A | Discharge status: Home / Self Care | Payer: Medicare HMO | Source: Ambulatory Visit | Attending: Pulmonary Disease | Admitting: Pulmonary Disease

## 2017-06-26 ENCOUNTER — Other Ambulatory Visit: Payer: Self-pay | Admitting: Pulmonary Disease

## 2017-06-26 DIAGNOSIS — J90 Pleural effusion, not elsewhere classified: Secondary | ICD-10-CM | POA: Diagnosis not present

## 2017-06-26 DIAGNOSIS — I3139 Other pericardial effusion (noninflammatory): Secondary | ICD-10-CM

## 2017-06-26 DIAGNOSIS — J986 Disorders of diaphragm: Secondary | ICD-10-CM | POA: Insufficient documentation

## 2017-06-26 DIAGNOSIS — I313 Pericardial effusion (noninflammatory): Secondary | ICD-10-CM

## 2017-06-26 DIAGNOSIS — I5030 Unspecified diastolic (congestive) heart failure: Secondary | ICD-10-CM | POA: Insufficient documentation

## 2017-06-26 DIAGNOSIS — D860 Sarcoidosis of lung: Secondary | ICD-10-CM | POA: Diagnosis not present

## 2017-06-26 LAB — ECHOCARDIOGRAM COMPLETE
AVLVOTPG: 4 mmHg
E decel time: 394 msec
E/e' ratio: 16.03
FS: 38 % (ref 28–44)
IVS/LV PW RATIO, ED: 1.26
LA ID, A-P, ES: 21 mm
LA diam index: 0.97 cm/m2
LA vol index: 22.6 mL/m2
LA vol: 48.7 mL
LAVOLA4C: 52 mL
LEFT ATRIUM END SYS DIAM: 21 mm
LV E/e' medial: 16.03
LV E/e'average: 16.03
LV SIMPSON'S DISK: 71
LV TDI E'LATERAL: 5.77
LV TDI E'MEDIAL: 5
LV dias vol index: 26 mL/m2
LV dias vol: 55 mL (ref 46–106)
LV sys vol index: 7 mL/m2
LVELAT: 5.77 cm/s
LVOT SV: 49 mL
LVOT VTI: 21.7 cm
LVOT area: 2.27 cm2
LVOT peak vel: 106 cm/s
LVOTD: 17 mm
LVSYSVOL: 16 mL (ref 14–42)
MV Dec: 394
MVPG: 3 mmHg
MVPKAVEL: 95.1 m/s
MVPKEVEL: 92.5 m/s
PW: 9.59 mm — AB (ref 0.6–1.1)
RV LATERAL S' VELOCITY: 9.79 cm/s
Stroke v: 39 ml
TAPSE: 18.3 mm

## 2017-06-26 NOTE — Progress Notes (Signed)
*  PRELIMINARY RESULTS* Echocardiogram 2D Echocardiogram has been performed.  Shelley Gonzalez, Shelley Gonzalez J 06/26/2017, 10:05 AM

## 2017-07-01 DIAGNOSIS — E662 Morbid (severe) obesity with alveolar hypoventilation: Secondary | ICD-10-CM | POA: Diagnosis not present

## 2017-07-01 DIAGNOSIS — D860 Sarcoidosis of lung: Secondary | ICD-10-CM | POA: Diagnosis not present

## 2017-07-01 DIAGNOSIS — E119 Type 2 diabetes mellitus without complications: Secondary | ICD-10-CM | POA: Diagnosis not present

## 2017-07-01 DIAGNOSIS — E041 Nontoxic single thyroid nodule: Secondary | ICD-10-CM | POA: Diagnosis not present

## 2017-07-01 DIAGNOSIS — Z6836 Body mass index (BMI) 36.0-36.9, adult: Secondary | ICD-10-CM | POA: Diagnosis not present

## 2017-07-01 DIAGNOSIS — I10 Essential (primary) hypertension: Secondary | ICD-10-CM | POA: Diagnosis not present

## 2017-07-01 DIAGNOSIS — K449 Diaphragmatic hernia without obstruction or gangrene: Secondary | ICD-10-CM | POA: Diagnosis not present

## 2017-07-01 DIAGNOSIS — J9622 Acute and chronic respiratory failure with hypercapnia: Secondary | ICD-10-CM | POA: Diagnosis not present

## 2017-07-01 DIAGNOSIS — Z9981 Dependence on supplemental oxygen: Secondary | ICD-10-CM | POA: Diagnosis not present

## 2017-07-02 DIAGNOSIS — Z6836 Body mass index (BMI) 36.0-36.9, adult: Secondary | ICD-10-CM | POA: Diagnosis not present

## 2017-07-02 DIAGNOSIS — E119 Type 2 diabetes mellitus without complications: Secondary | ICD-10-CM | POA: Diagnosis not present

## 2017-07-02 DIAGNOSIS — Z9981 Dependence on supplemental oxygen: Secondary | ICD-10-CM | POA: Diagnosis not present

## 2017-07-02 DIAGNOSIS — K449 Diaphragmatic hernia without obstruction or gangrene: Secondary | ICD-10-CM | POA: Diagnosis not present

## 2017-07-02 DIAGNOSIS — E662 Morbid (severe) obesity with alveolar hypoventilation: Secondary | ICD-10-CM | POA: Diagnosis not present

## 2017-07-02 DIAGNOSIS — E041 Nontoxic single thyroid nodule: Secondary | ICD-10-CM | POA: Diagnosis not present

## 2017-07-02 DIAGNOSIS — D860 Sarcoidosis of lung: Secondary | ICD-10-CM | POA: Diagnosis not present

## 2017-07-02 DIAGNOSIS — I10 Essential (primary) hypertension: Secondary | ICD-10-CM | POA: Diagnosis not present

## 2017-07-02 DIAGNOSIS — J9622 Acute and chronic respiratory failure with hypercapnia: Secondary | ICD-10-CM | POA: Diagnosis not present

## 2017-07-03 ENCOUNTER — Ambulatory Visit: Payer: Medicare HMO | Attending: Pulmonary Disease | Admitting: Pulmonary Disease

## 2017-07-03 DIAGNOSIS — Z6836 Body mass index (BMI) 36.0-36.9, adult: Secondary | ICD-10-CM | POA: Diagnosis not present

## 2017-07-03 DIAGNOSIS — K449 Diaphragmatic hernia without obstruction or gangrene: Secondary | ICD-10-CM | POA: Diagnosis not present

## 2017-07-03 DIAGNOSIS — E662 Morbid (severe) obesity with alveolar hypoventilation: Secondary | ICD-10-CM | POA: Diagnosis not present

## 2017-07-03 DIAGNOSIS — D860 Sarcoidosis of lung: Secondary | ICD-10-CM | POA: Diagnosis not present

## 2017-07-03 DIAGNOSIS — G4736 Sleep related hypoventilation in conditions classified elsewhere: Secondary | ICD-10-CM | POA: Diagnosis not present

## 2017-07-03 DIAGNOSIS — E669 Obesity, unspecified: Secondary | ICD-10-CM | POA: Diagnosis not present

## 2017-07-03 DIAGNOSIS — J9602 Acute respiratory failure with hypercapnia: Secondary | ICD-10-CM | POA: Insufficient documentation

## 2017-07-03 DIAGNOSIS — G4733 Obstructive sleep apnea (adult) (pediatric): Secondary | ICD-10-CM | POA: Diagnosis not present

## 2017-07-03 DIAGNOSIS — J9622 Acute and chronic respiratory failure with hypercapnia: Secondary | ICD-10-CM | POA: Diagnosis not present

## 2017-07-03 DIAGNOSIS — E041 Nontoxic single thyroid nodule: Secondary | ICD-10-CM | POA: Diagnosis not present

## 2017-07-03 DIAGNOSIS — E119 Type 2 diabetes mellitus without complications: Secondary | ICD-10-CM | POA: Diagnosis not present

## 2017-07-03 DIAGNOSIS — I10 Essential (primary) hypertension: Secondary | ICD-10-CM | POA: Diagnosis not present

## 2017-07-03 DIAGNOSIS — Z9981 Dependence on supplemental oxygen: Secondary | ICD-10-CM | POA: Diagnosis not present

## 2017-07-08 DIAGNOSIS — J9622 Acute and chronic respiratory failure with hypercapnia: Secondary | ICD-10-CM | POA: Diagnosis not present

## 2017-07-08 DIAGNOSIS — Z6836 Body mass index (BMI) 36.0-36.9, adult: Secondary | ICD-10-CM | POA: Diagnosis not present

## 2017-07-08 DIAGNOSIS — D860 Sarcoidosis of lung: Secondary | ICD-10-CM | POA: Diagnosis not present

## 2017-07-08 DIAGNOSIS — E119 Type 2 diabetes mellitus without complications: Secondary | ICD-10-CM | POA: Diagnosis not present

## 2017-07-08 DIAGNOSIS — E041 Nontoxic single thyroid nodule: Secondary | ICD-10-CM | POA: Diagnosis not present

## 2017-07-08 DIAGNOSIS — K449 Diaphragmatic hernia without obstruction or gangrene: Secondary | ICD-10-CM | POA: Diagnosis not present

## 2017-07-08 DIAGNOSIS — I10 Essential (primary) hypertension: Secondary | ICD-10-CM | POA: Diagnosis not present

## 2017-07-08 DIAGNOSIS — E662 Morbid (severe) obesity with alveolar hypoventilation: Secondary | ICD-10-CM | POA: Diagnosis not present

## 2017-07-08 DIAGNOSIS — Z9981 Dependence on supplemental oxygen: Secondary | ICD-10-CM | POA: Diagnosis not present

## 2017-07-09 DIAGNOSIS — K449 Diaphragmatic hernia without obstruction or gangrene: Secondary | ICD-10-CM | POA: Diagnosis not present

## 2017-07-09 DIAGNOSIS — I10 Essential (primary) hypertension: Secondary | ICD-10-CM | POA: Diagnosis not present

## 2017-07-09 DIAGNOSIS — E662 Morbid (severe) obesity with alveolar hypoventilation: Secondary | ICD-10-CM | POA: Diagnosis not present

## 2017-07-09 DIAGNOSIS — D860 Sarcoidosis of lung: Secondary | ICD-10-CM | POA: Diagnosis not present

## 2017-07-09 DIAGNOSIS — Z9981 Dependence on supplemental oxygen: Secondary | ICD-10-CM | POA: Diagnosis not present

## 2017-07-09 DIAGNOSIS — E119 Type 2 diabetes mellitus without complications: Secondary | ICD-10-CM | POA: Diagnosis not present

## 2017-07-09 DIAGNOSIS — E041 Nontoxic single thyroid nodule: Secondary | ICD-10-CM | POA: Diagnosis not present

## 2017-07-09 DIAGNOSIS — J9622 Acute and chronic respiratory failure with hypercapnia: Secondary | ICD-10-CM | POA: Diagnosis not present

## 2017-07-09 DIAGNOSIS — Z6836 Body mass index (BMI) 36.0-36.9, adult: Secondary | ICD-10-CM | POA: Diagnosis not present

## 2017-07-14 DIAGNOSIS — D860 Sarcoidosis of lung: Secondary | ICD-10-CM | POA: Diagnosis not present

## 2017-07-14 DIAGNOSIS — K449 Diaphragmatic hernia without obstruction or gangrene: Secondary | ICD-10-CM | POA: Diagnosis not present

## 2017-07-14 DIAGNOSIS — J9622 Acute and chronic respiratory failure with hypercapnia: Secondary | ICD-10-CM | POA: Diagnosis not present

## 2017-07-14 DIAGNOSIS — I10 Essential (primary) hypertension: Secondary | ICD-10-CM | POA: Diagnosis not present

## 2017-07-14 DIAGNOSIS — E041 Nontoxic single thyroid nodule: Secondary | ICD-10-CM | POA: Diagnosis not present

## 2017-07-14 DIAGNOSIS — Z6836 Body mass index (BMI) 36.0-36.9, adult: Secondary | ICD-10-CM | POA: Diagnosis not present

## 2017-07-14 DIAGNOSIS — E662 Morbid (severe) obesity with alveolar hypoventilation: Secondary | ICD-10-CM | POA: Diagnosis not present

## 2017-07-14 DIAGNOSIS — Z9981 Dependence on supplemental oxygen: Secondary | ICD-10-CM | POA: Diagnosis not present

## 2017-07-14 DIAGNOSIS — E119 Type 2 diabetes mellitus without complications: Secondary | ICD-10-CM | POA: Diagnosis not present

## 2017-07-15 ENCOUNTER — Ambulatory Visit (INDEPENDENT_AMBULATORY_CARE_PROVIDER_SITE_OTHER): Payer: Medicare HMO | Admitting: Pulmonary Disease

## 2017-07-15 ENCOUNTER — Encounter: Payer: Self-pay | Admitting: Pulmonary Disease

## 2017-07-15 DIAGNOSIS — J9611 Chronic respiratory failure with hypoxia: Secondary | ICD-10-CM

## 2017-07-15 DIAGNOSIS — I3139 Other pericardial effusion (noninflammatory): Secondary | ICD-10-CM

## 2017-07-15 DIAGNOSIS — I313 Pericardial effusion (noninflammatory): Secondary | ICD-10-CM

## 2017-07-15 DIAGNOSIS — J9612 Chronic respiratory failure with hypercapnia: Secondary | ICD-10-CM

## 2017-07-15 NOTE — Progress Notes (Signed)
   Subjective:    Patient ID: Shelley Gonzalez, female    DOB: 12/11/1940, 76 y.o.   MRN: [id number]   HPI  07/15/17  76 year old never smoker here for follow up of her hypoxic and hypercarbic respiratory failure.   Prior History: Last seen 06/11/17 to establish care after hospitalization in [facility] for acute hypoxic and hypercarbic respiratory failure. She was admitted 05/22/17 after her PCP found her to by hypoxic to 70% on RA during a routine office visit. ABG was 7.32/80 7/50 on 2 L oxygen. She was treated with oxygen and BiPAP. Repeat ABG on 10/20 was 7.3 04/21/41 on 2 L of oxygen. She was discharged on 2L oxygen and BiPAP.  CXR showed elevated left hemidiaphragm. Sniff test 06/26/17 consistent with left diaphragmatic paralysis.   Repeat echocardiogram 11/30 with persistent pericardial effusion. EF 65-70%, grade 1 diastolic dysfunction. Unable to assess PA pressures.   Today, patient is 97% on her home 2L [place]. She endorses persistent SOB and dyspnea with light exertion. Reports these symptoms have been on going for years. Wears her oxygen at all times throughout the day and BiPAP QHS.   She has declined pulmonary rehab. Has home health PT currently. Planning [facility].   Unable to perform PFTs today.    ROS: Review of Systems  Constitutional: Negative for diaphoresis, fever and weight loss.  Respiratory: Positive for shortness of breath. Negative for cough and wheezing.   Gastrointestinal: Negative for abdominal pain and constipation.  Genitourinary: Negative for dysuria.  Musculoskeletal: Negative for myalgias.  Neurological: Negative for dizziness and loss of consciousness.  Psychiatric/Behavioral: Negative for depression.       Objective:   BP 124/82 (BP Location: Left Arm, Patient Position: Sitting, Cuff Size: Large)   Pulse 93   Ht 5' 5.5" (1.664 m)   Wt 214 lb (97.1 kg)   SpO2 97%   BMI 35.07 kg/m  Filed Weights   07/15/17  1445  Weight: 214 lb (97.1 kg)    Physical Exam Constitutional: Obese, NAD, appears comfortable HEENT: Atraumatic, normocephalic. PERRL, anicteric sclera.  Neck: Supple, trachea midline.  Cardiovascular: RRR, no murmurs, rubs, or gallops.  Pulmonary/Chest: CTAB, no wheezes, rales, or rhonchi.  Abdominal: Soft, non tender, non distended. +BS.  Extremities: Warm and well perfused. Distal pulses intact. No edema.  Neurological: A&Ox3, CN II - XII grossly intact.  Skin: No rashes or erythema  Psychiatric: Normal mood and affect         Assessment & Plan:  Please see problem based charting.  No Follow-up on file.

## 2017-07-15 NOTE — Progress Notes (Signed)
Unable to complete PFT today 07/15/17

## 2017-07-15 NOTE — Patient Instructions (Signed)
Please use your BiPAP machine every night. Download will be checked in 1 month.  We will put in request for portable concentrator.  Repeat echo in February 2019-limited to check for fluid around the heart

## 2017-07-15 NOTE — Assessment & Plan Note (Signed)
Repeat echo 11/30 with persistent effusion that appears to be organizing. Plan to repeat in Feb 2019. If persistent or worsening may need cardiology referral.

## 2017-07-15 NOTE — Addendum Note (Signed)
Addended by: Maurene CapesPOTTS, Magalene Mclear M on: 07/15/2017 03:47 PM   Modules accepted: Orders

## 2017-07-15 NOTE — Assessment & Plan Note (Signed)
Secondary to left diaphragmatic paralysis. Initially had issues with her BiPAP facemask that is now better - started using her BiPAP 2 days ago. Will continue supplemental oxygen 2L at all times, BiPAP QHS. Encouraged compliance. Follow up 3 months.

## 2017-07-16 DIAGNOSIS — D860 Sarcoidosis of lung: Secondary | ICD-10-CM | POA: Diagnosis not present

## 2017-07-16 DIAGNOSIS — Z9981 Dependence on supplemental oxygen: Secondary | ICD-10-CM | POA: Diagnosis not present

## 2017-07-16 DIAGNOSIS — E119 Type 2 diabetes mellitus without complications: Secondary | ICD-10-CM | POA: Diagnosis not present

## 2017-07-16 DIAGNOSIS — E041 Nontoxic single thyroid nodule: Secondary | ICD-10-CM | POA: Diagnosis not present

## 2017-07-16 DIAGNOSIS — J9622 Acute and chronic respiratory failure with hypercapnia: Secondary | ICD-10-CM | POA: Diagnosis not present

## 2017-07-16 DIAGNOSIS — I10 Essential (primary) hypertension: Secondary | ICD-10-CM | POA: Diagnosis not present

## 2017-07-16 DIAGNOSIS — K449 Diaphragmatic hernia without obstruction or gangrene: Secondary | ICD-10-CM | POA: Diagnosis not present

## 2017-07-16 DIAGNOSIS — Z6836 Body mass index (BMI) 36.0-36.9, adult: Secondary | ICD-10-CM | POA: Diagnosis not present

## 2017-07-16 DIAGNOSIS — E662 Morbid (severe) obesity with alveolar hypoventilation: Secondary | ICD-10-CM | POA: Diagnosis not present

## 2017-07-17 DIAGNOSIS — I313 Pericardial effusion (noninflammatory): Secondary | ICD-10-CM | POA: Diagnosis not present

## 2017-07-17 DIAGNOSIS — D869 Sarcoidosis, unspecified: Secondary | ICD-10-CM | POA: Diagnosis not present

## 2017-07-17 DIAGNOSIS — Z6839 Body mass index (BMI) 39.0-39.9, adult: Secondary | ICD-10-CM | POA: Diagnosis not present

## 2017-07-17 DIAGNOSIS — G4710 Hypersomnia, unspecified: Secondary | ICD-10-CM | POA: Diagnosis not present

## 2017-07-17 DIAGNOSIS — E041 Nontoxic single thyroid nodule: Secondary | ICD-10-CM | POA: Diagnosis not present

## 2017-07-17 DIAGNOSIS — E039 Hypothyroidism, unspecified: Secondary | ICD-10-CM | POA: Diagnosis not present

## 2017-07-17 DIAGNOSIS — E1165 Type 2 diabetes mellitus with hyperglycemia: Secondary | ICD-10-CM | POA: Diagnosis not present

## 2017-07-17 DIAGNOSIS — G4733 Obstructive sleep apnea (adult) (pediatric): Secondary | ICD-10-CM | POA: Diagnosis not present

## 2017-07-17 NOTE — Procedures (Signed)
Patient Name: Shelley Gonzalez, Shelley Gonzalez Study Date: 07/03/2017 Gender: Female D.O.B: 07-06-1941 Age (years): [age] Referring Provider: Cyril Mourningakesh Alva MD, ABSM Height (inches): 65 Interpreting Physician: Cyril Mourningakesh Alva MD, ABSM Weight (lbs): 214 RPSGT: Peak, Robert BMI: 36 MRN: [id number] Neck Size: 16.50  CLINICAL INFORMATION Sleep Study Type: acute hypercarbic respiratory failure, obesity, snoring  Indication for sleep study: OSA  Epworth Sleepiness Score: 0    SLEEP STUDY TECHNIQUE As per the AASM Manual for the Scoring of Sleep and Associated Events v2.3 (April 2016) with a hypopnea requiring 4% desaturations.  The channels recorded and monitored were frontal, central and occipital EEG, electrooculogram (EOG), submentalis EMG (chin), nasal and oral airflow, thoracic and abdominal wall motion, anterior tibialis EMG, snore microphone, electrocardiogram, and pulse oximetry.  SLEEP ARCHITECTURE The study was initiated at 10:37:58 PM and ended at 4:40:17 AM.  Sleep onset time was 21.1 minutes and the sleep efficiency was 47.3%. The total sleep time was 171.5 minutes.  Stage REM latency was 118.0 minutes.  The patient spent 17.78% of the night in stage N1 sleep, 80.47% in stage N2 sleep, 0.00% in stage N3 and 1.75% in REM.  Alpha intrusion was absent.  Supine sleep was 0.00%.  RESPIRATORY PARAMETERS The overall apnea/hypopnea index (AHI) was 0.3 per hour. There were 0 total apneas, including 0 obstructive, 0 central and 0 mixed apneas. There were 1 hypopneas and 5 RERAs.  The AHI during Stage REM sleep was 0.0 per hour.  AHI while supine was N/A per hour.  The mean oxygen saturation was 88.03%. The minimum SpO2 during sleep was 85.00%.  soft snoring was noted during this study.  CARDIAC DATA The 2 lead EKG demonstrated sinus rhythm. The mean heart rate was 79.77 beats per minute. Other EKG findings include: None. LEG MOVEMENT DATA The total PLMS were 0 with a resulting PLMS index of  0.00. Associated arousal with leg movement index was 0.0 .  IMPRESSIONS - No significant obstructive sleep apnea occurred during this study (AHI = 0.3/h). - No significant central sleep apnea occurred during this study (CAI = 0.0/h). - The study was performed on 3L O2.Moderate oxygen desaturation was noted during this study (Min O2 = 85.00%). - The patient snored with soft snoring volume. - No cardiac abnormalities were noted during this study. - Clinically significant periodic limb movements did not occur during sleep. No significant associated arousals.   DIAGNOSIS - Nocturnal Hypoxemia (327.26 [G47.36 ICD-10])   RECOMMENDATIONS  - Use 3L O2 during sleep - Avoid alcohol, sedatives and other CNS depressants that may worsen sleep apnea and disrupt normal sleep architecture. - Sleep hygiene should be reviewed to assess factors that may improve sleep quality. - Weight management and regular exercise should be initiated or continued if appropriate.   Cyril Mourningakesh Alva MD Board Certified in Sleep medicine

## 2017-07-23 DIAGNOSIS — I10 Essential (primary) hypertension: Secondary | ICD-10-CM | POA: Diagnosis not present

## 2017-07-23 DIAGNOSIS — E041 Nontoxic single thyroid nodule: Secondary | ICD-10-CM | POA: Diagnosis not present

## 2017-07-23 DIAGNOSIS — E662 Morbid (severe) obesity with alveolar hypoventilation: Secondary | ICD-10-CM | POA: Diagnosis not present

## 2017-07-23 DIAGNOSIS — Z6836 Body mass index (BMI) 36.0-36.9, adult: Secondary | ICD-10-CM | POA: Diagnosis not present

## 2017-07-23 DIAGNOSIS — D860 Sarcoidosis of lung: Secondary | ICD-10-CM | POA: Diagnosis not present

## 2017-07-23 DIAGNOSIS — K449 Diaphragmatic hernia without obstruction or gangrene: Secondary | ICD-10-CM | POA: Diagnosis not present

## 2017-07-23 DIAGNOSIS — E119 Type 2 diabetes mellitus without complications: Secondary | ICD-10-CM | POA: Diagnosis not present

## 2017-07-23 DIAGNOSIS — J9622 Acute and chronic respiratory failure with hypercapnia: Secondary | ICD-10-CM | POA: Diagnosis not present

## 2017-07-23 DIAGNOSIS — Z9981 Dependence on supplemental oxygen: Secondary | ICD-10-CM | POA: Diagnosis not present

## 2017-07-26 DIAGNOSIS — D860 Sarcoidosis of lung: Secondary | ICD-10-CM | POA: Diagnosis not present

## 2017-08-26 DIAGNOSIS — D860 Sarcoidosis of lung: Secondary | ICD-10-CM | POA: Diagnosis not present

## 2017-08-31 DIAGNOSIS — R0602 Shortness of breath: Secondary | ICD-10-CM | POA: Diagnosis not present

## 2017-08-31 DIAGNOSIS — I10 Essential (primary) hypertension: Secondary | ICD-10-CM | POA: Diagnosis not present

## 2017-08-31 DIAGNOSIS — J9811 Atelectasis: Secondary | ICD-10-CM | POA: Diagnosis not present

## 2017-08-31 DIAGNOSIS — Z7984 Long term (current) use of oral hypoglycemic drugs: Secondary | ICD-10-CM | POA: Diagnosis not present

## 2017-08-31 DIAGNOSIS — Z79899 Other long term (current) drug therapy: Secondary | ICD-10-CM | POA: Diagnosis not present

## 2017-08-31 DIAGNOSIS — K449 Diaphragmatic hernia without obstruction or gangrene: Secondary | ICD-10-CM | POA: Diagnosis not present

## 2017-09-22 DIAGNOSIS — D8686 Sarcoid arthropathy: Secondary | ICD-10-CM | POA: Diagnosis not present

## 2017-09-22 DIAGNOSIS — D869 Sarcoidosis, unspecified: Secondary | ICD-10-CM | POA: Diagnosis not present

## 2017-09-22 DIAGNOSIS — I313 Pericardial effusion (noninflammatory): Secondary | ICD-10-CM | POA: Diagnosis not present

## 2017-09-22 DIAGNOSIS — E569 Vitamin deficiency, unspecified: Secondary | ICD-10-CM | POA: Diagnosis not present

## 2017-09-22 DIAGNOSIS — Z6837 Body mass index (BMI) 37.0-37.9, adult: Secondary | ICD-10-CM | POA: Diagnosis not present

## 2017-09-22 DIAGNOSIS — M7502 Adhesive capsulitis of left shoulder: Secondary | ICD-10-CM | POA: Diagnosis not present

## 2017-09-24 DIAGNOSIS — D860 Sarcoidosis of lung: Secondary | ICD-10-CM | POA: Diagnosis not present

## 2017-09-25 ENCOUNTER — Other Ambulatory Visit (HOSPITAL_COMMUNITY): Payer: Medicare HMO

## 2017-10-06 ENCOUNTER — Ambulatory Visit (HOSPITAL_COMMUNITY): Admission: RE | Admit: 2017-10-06 | Payer: Medicare HMO | Source: Ambulatory Visit

## 2017-10-14 ENCOUNTER — Encounter: Payer: Self-pay | Admitting: Adult Health

## 2017-10-14 ENCOUNTER — Ambulatory Visit (INDEPENDENT_AMBULATORY_CARE_PROVIDER_SITE_OTHER): Payer: Medicare HMO | Admitting: Adult Health

## 2017-10-14 VITALS — BP 140/84 | HR 87 | Ht 65.5 in | Wt 220.0 lb

## 2017-10-14 DIAGNOSIS — I3139 Other pericardial effusion (noninflammatory): Secondary | ICD-10-CM

## 2017-10-14 DIAGNOSIS — J9611 Chronic respiratory failure with hypoxia: Secondary | ICD-10-CM

## 2017-10-14 DIAGNOSIS — J9612 Chronic respiratory failure with hypercapnia: Secondary | ICD-10-CM

## 2017-10-14 DIAGNOSIS — I2729 Other secondary pulmonary hypertension: Secondary | ICD-10-CM | POA: Diagnosis not present

## 2017-10-14 DIAGNOSIS — I313 Pericardial effusion (noninflammatory): Secondary | ICD-10-CM

## 2017-10-14 NOTE — Progress Notes (Signed)
 @Patient  ID: Shelley Gonzalez, female    DOB: 07/21/1941, 77 y.o.   MRN: [id number]  Chief Complaint  Patient presents with  . Follow-up    OSA     Referring provider: Selinda FlavinHoward, Kevin, MD  HPI: 77 year old female never smoker followed for hypoxic and hypercarbic respiratory failure on BiPAP and oxygen , left hemidiaphragm paralysis  TEST  ABG was 7.32/80 7/50 on 2 L oxygen Sniff test 06/26/17 consistent with left diaphragmatic paralysis.  2D echo June 26, 2017 EF 65%, grade 1 diastolic dysfunction, moderate pericardial effusion Sleep study December 2018- for obstructive sleep apnea, positive for nocturnal hypoxemia   10/14/2017 Follow up : Hypoxic and hypercarbic respiratory failure Patient returns for 3337-month follow-up.  Patient says that she has not been wearing her BiPAP at bedtime.  We discussed her underlying chronic hypercarbia and left hemidiaphragm paralysis.  And the need to use her BiPAP at night.  She said that she would retry this and try to wear it each night. Patient also has a known pericardial effusion was set up for a follow-up echo however patient did not go for her appointment.  We discussed the importance of keeping her appointments. She denies any increased shortness of breath, extremity edema. She does remain on oxygen at 2 L   No Known Allergies  Immunization History  Administered Date(s) Administered  . Influenza, High Dose Seasonal PF 04/27/2017  . Pneumococcal Polysaccharide-23 07/29/2015    History reviewed. No pertinent past medical history.  Tobacco History: Social History   Tobacco Use  Smoking Status Never Smoker  Smokeless Tobacco Never Used   Counseling given: Not Answered   Outpatient Encounter Medications as of 10/14/2017  Medication Sig  . albuterol (PROVENTIL) (2.5 MG/3ML) 0.083% nebulizer solution 2.5 mg/3 mL (0.083%) solution; inhalation three times a day  . aspirin EC 325 MG tablet Take 325 mg by mouth daily.   . furosemide  (LASIX) 40 MG tablet TAKE 1/2 TABLET BY MOUTH DAILY FOR FLUID  . metFORMIN (GLUCOPHAGE) 500 MG tablet Take 2 (two) times daily with a meal by mouth.  Marland Kitchen. NIFEdipine (PROCARDIA XL/ADALAT-CC) 90 MG 24 hr tablet Take 90 mg daily by mouth.   No facility-administered encounter medications on file as of 10/14/2017.      Review of Systems  Constitutional:   No  weight loss, night sweats,  Fevers, chills, + fatigue, or  lassitude.  HEENT:   No headaches,  Difficulty swallowing,  Tooth/dental problems, or  Sore throat,                No sneezing, itching, ear ache, nasal congestion, post nasal drip,   CV:  No chest pain,  Orthopnea, PND, swelling in lower extremities, anasarca, dizziness, palpitations, syncope.   GI  No heartburn, indigestion, abdominal pain, nausea, vomiting, diarrhea, change in bowel habits, loss of appetite, bloody stools.   Resp:  No excess mucus, no productive cough,  No non-productive cough,  No coughing up of blood.  No change in color of mucus.  No wheezing.  No chest wall deformity  Skin: no rash or lesions.  GU: no dysuria, change in color of urine, no urgency or frequency.  No flank pain, no hematuria   MS:  No joint pain or swelling.  No decreased range of motion.  No back pain.    Physical Exam  BP 140/84 (BP Location: Right Arm, Cuff Size: Normal)   Pulse 87   Ht 5' 5.5" (1.664 m)   [employer] FinancialWt  220 lb (99.8 kg)   SpO2 97%   BMI 36.05 kg/m   GEN: A/Ox3; pleasant , NAD, obese    HEENT:  [place]/AT,  EACs-clear, TMs-wnl, NOSE-clear, THROAT-clear, no lesions, no postnasal drip or exudate noted. Class 2-3 MP airway   NECK:  Supple w/ fair ROM; no JVD; normal carotid impulses w/o bruits; no thyromegaly or nodules palpated; no lymphadenopathy.    RESP  Clear  P & A; w/o, wheezes/ rales/ or rhonchi. no accessory muscle use, no dullness to percussion  CARD:  RRR, no m/r/g, tr  peripheral edema, pulses intact, no cyanosis or clubbing.  GI:   Soft & nt; nml bowel sounds;  no organomegaly or masses detected.   Musco: Warm bil, no deformities or joint swelling noted.   Neuro: alert, no focal deficits noted.    Skin: Warm, no lesions or rashes    Lab Results:  CBC No results found for: WBC, RBC, HGB, HCT, PLT, MCV, MCH, MCHC, RDW, LYMPHSABS, MONOABS, EOSABS, BASOSABS  BMET No results found for: NA, K, CL, CO2, GLUCOSE, BUN, CREATININE, CALCIUM, GFRNONAA, GFRAA  BNP No results found for: BNP  ProBNP No results found for: PROBNP  Imaging: No results found.   Assessment & Plan:   No problem-specific Assessment & Plan notes found for this encounter.     Rubye Oaks, NP 10/14/2017

## 2017-10-14 NOTE — Patient Instructions (Addendum)
Set up for 2 D echo .  Restart BIPAP At bedtime  .  Try to wear all night .  Continue on Oxygen 2l/m .  Follow up with Dr. Vassie LollAlva  In 3-4 months and As needed

## 2017-10-16 DIAGNOSIS — R0602 Shortness of breath: Secondary | ICD-10-CM | POA: Diagnosis not present

## 2017-10-16 DIAGNOSIS — D860 Sarcoidosis of lung: Secondary | ICD-10-CM | POA: Diagnosis not present

## 2017-10-16 DIAGNOSIS — R269 Unspecified abnormalities of gait and mobility: Secondary | ICD-10-CM | POA: Diagnosis not present

## 2017-10-16 NOTE — Assessment & Plan Note (Signed)
Set up follow up echo .  Encouraged on compliance with appointments

## 2017-10-16 NOTE — Assessment & Plan Note (Signed)
Continue on oxygen at 2 L.  Patient is encouraged on BiPAP compliance at bedtime.  Plan  Patient Instructions  Set up for 2 D echo .  Restart BIPAP At bedtime  .  Try to wear all night .  Continue on Oxygen 2l/m .  Follow up with Dr. Vassie LollAlva  In 3-4 months and As needed

## 2017-10-24 DIAGNOSIS — D860 Sarcoidosis of lung: Secondary | ICD-10-CM | POA: Diagnosis not present

## 2017-11-04 ENCOUNTER — Other Ambulatory Visit: Payer: Medicare HMO

## 2017-11-06 ENCOUNTER — Other Ambulatory Visit: Payer: Self-pay

## 2017-11-06 ENCOUNTER — Ambulatory Visit (INDEPENDENT_AMBULATORY_CARE_PROVIDER_SITE_OTHER): Payer: Medicare HMO

## 2017-11-06 DIAGNOSIS — I313 Pericardial effusion (noninflammatory): Secondary | ICD-10-CM

## 2017-11-06 DIAGNOSIS — I3139 Other pericardial effusion (noninflammatory): Secondary | ICD-10-CM

## 2017-11-16 DIAGNOSIS — R269 Unspecified abnormalities of gait and mobility: Secondary | ICD-10-CM | POA: Diagnosis not present

## 2017-11-16 DIAGNOSIS — D860 Sarcoidosis of lung: Secondary | ICD-10-CM | POA: Diagnosis not present

## 2017-11-16 DIAGNOSIS — R0602 Shortness of breath: Secondary | ICD-10-CM | POA: Diagnosis not present

## 2017-11-24 DIAGNOSIS — D860 Sarcoidosis of lung: Secondary | ICD-10-CM | POA: Diagnosis not present

## 2017-12-08 IMAGING — RF DG SNIFF TEST
7 series · 10 of 10 positions shown · non-contrast
Comparison: Chest radiograph 05/21/2017

CLINICAL DATA: Pericardial effusion, paralysis of diaphragm
muscles, low oxygen saturation

EXAM:
CHEST FLUOROSCOPY - SNIFF TEST
TECHNIQUE: Real-time fluoroscopic evaluation of the chest was performed.
FLUOROSCOPY TIME:  Fluoroscopy Time:  0 minutes 54 seconds
Radiation Exposure Index (if provided by the fluoroscopic device):
8.7 mGy
Number of Acquired Spot Images: Insert spots

[Series 1: cp_chest · 0.27mm/px · 1 of 1 slices shown (1 of 7)]
[im 1/1]
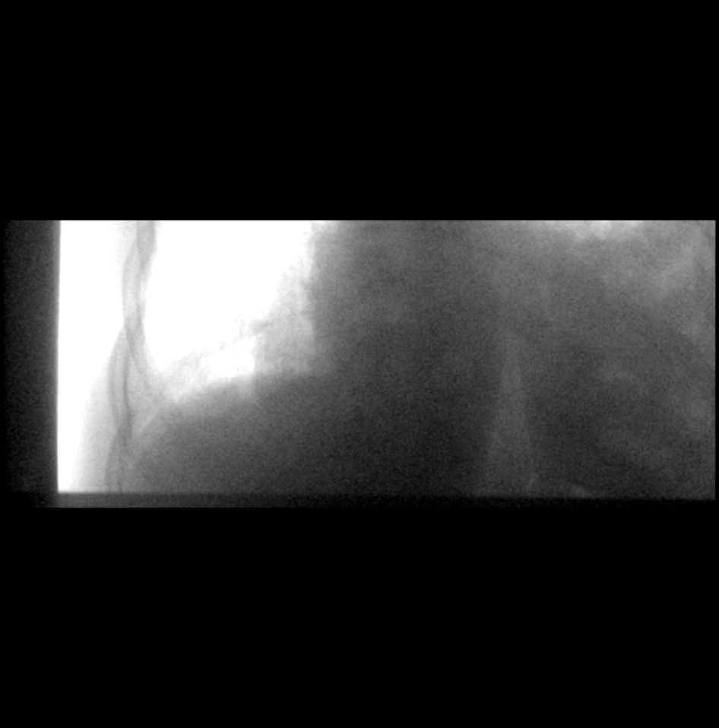

[Series 2: cp_chest · 0.27mm/px · 1 of 1 slices shown (2 of 7)]
[im 1/1]
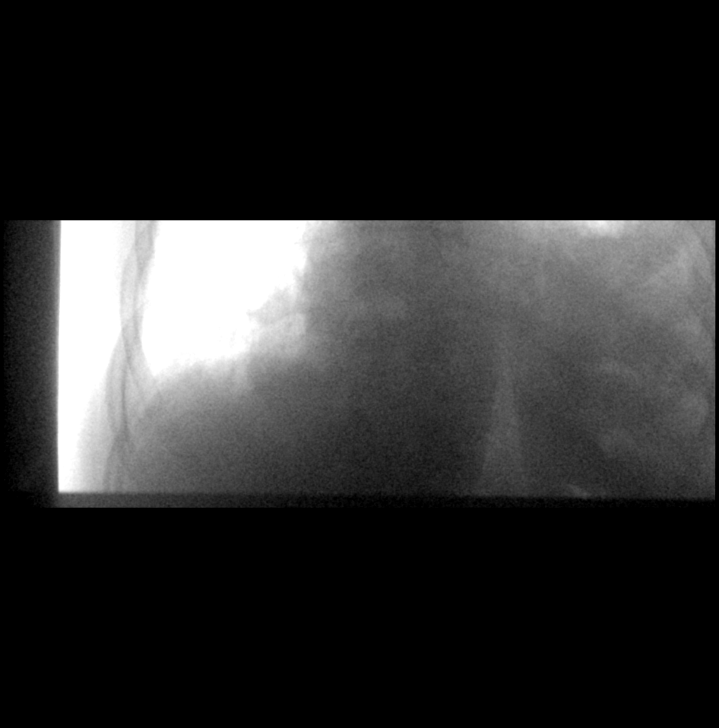

[Series 3: cp_chest · 0.27mm/px · 1 of 1 slices shown (3 of 7)]
[im 1/1]
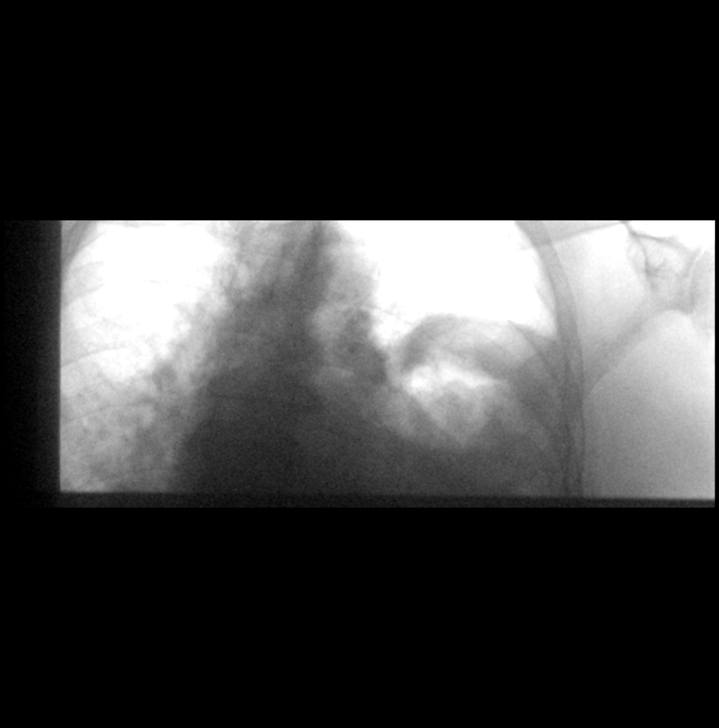

[Series 4: cp_chest · 0.27mm/px · 1 of 1 slices shown (4 of 7)]
[im 1/1]
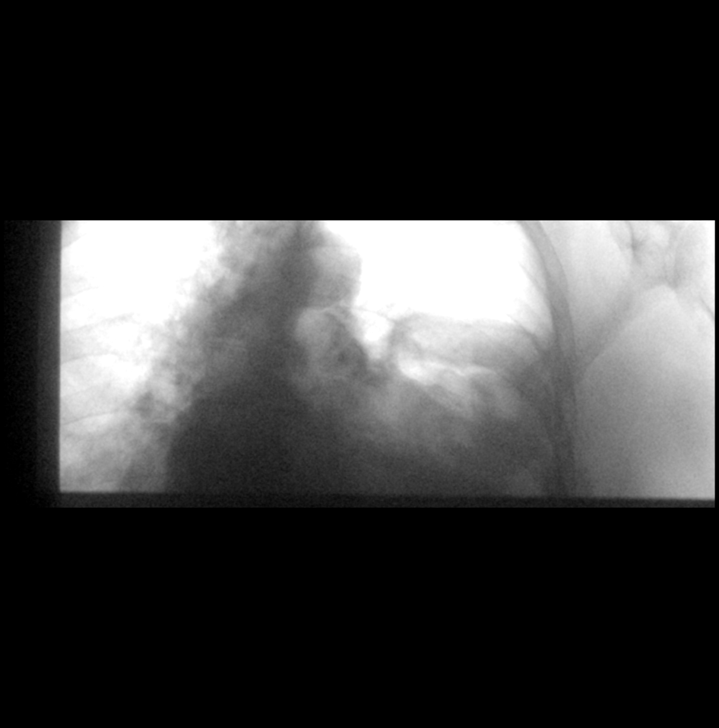

[Series 5: cp_chest · 0.27mm/px · 1 of 1 slices shown (5 of 7)]
[im 1/1]
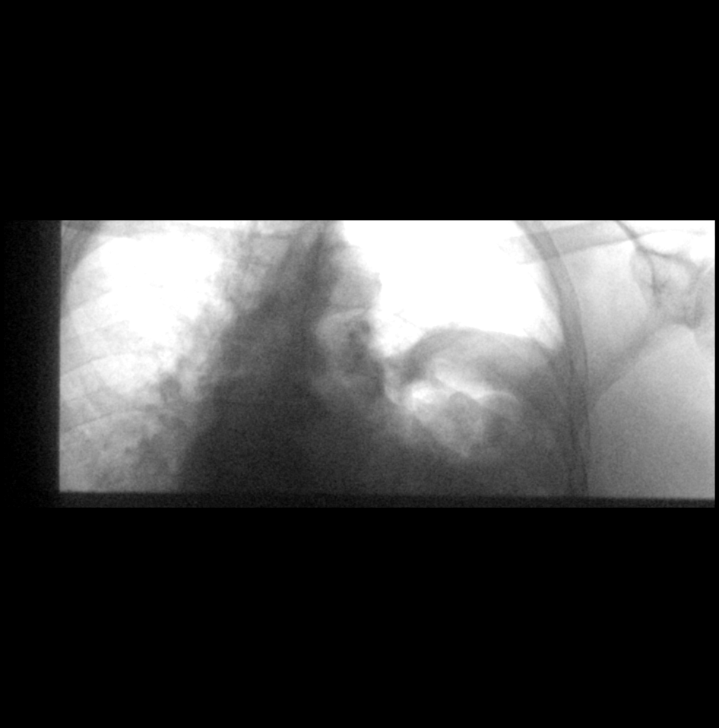

[Series 6: cp_chest · 0.27mm/px · 1 of 1 slices shown (6 of 7)]
[im 1/1]
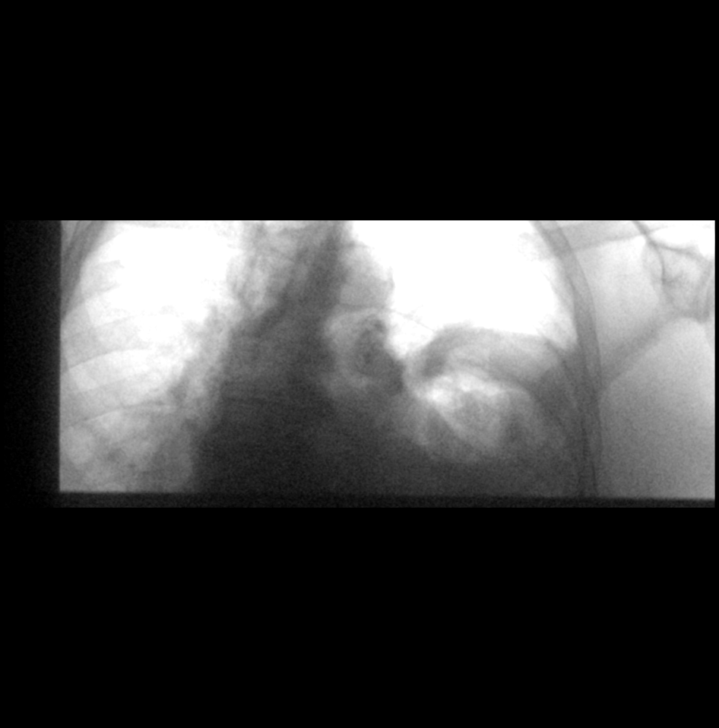

[Series 7: cp_chest · 0.27mm/px · 4 of 63 frames shown (7 of 7)]
[frame 10/63]
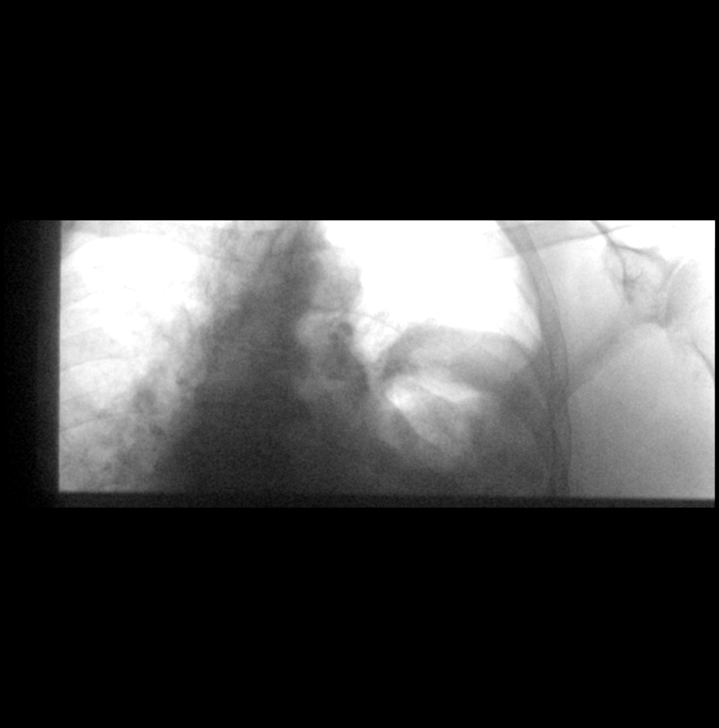
[frame 32/63]
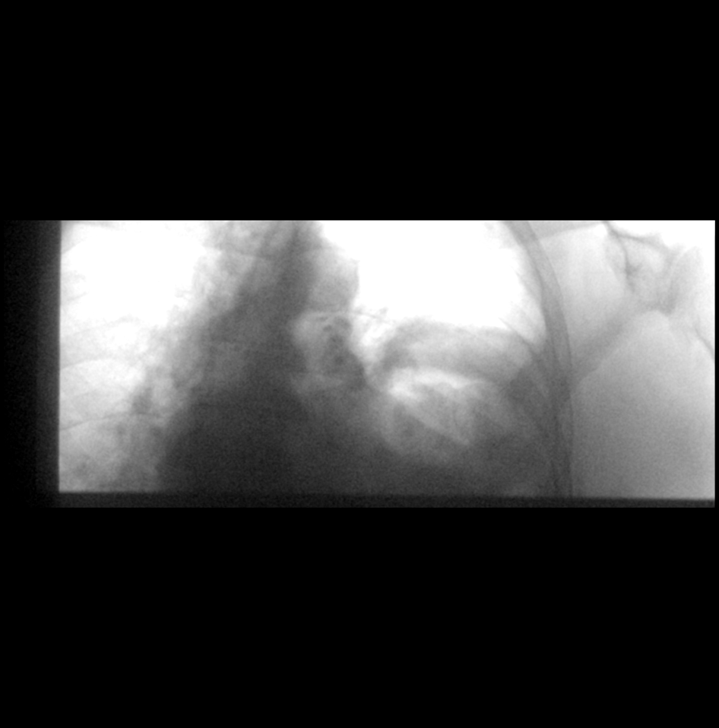
[frame 34/63]
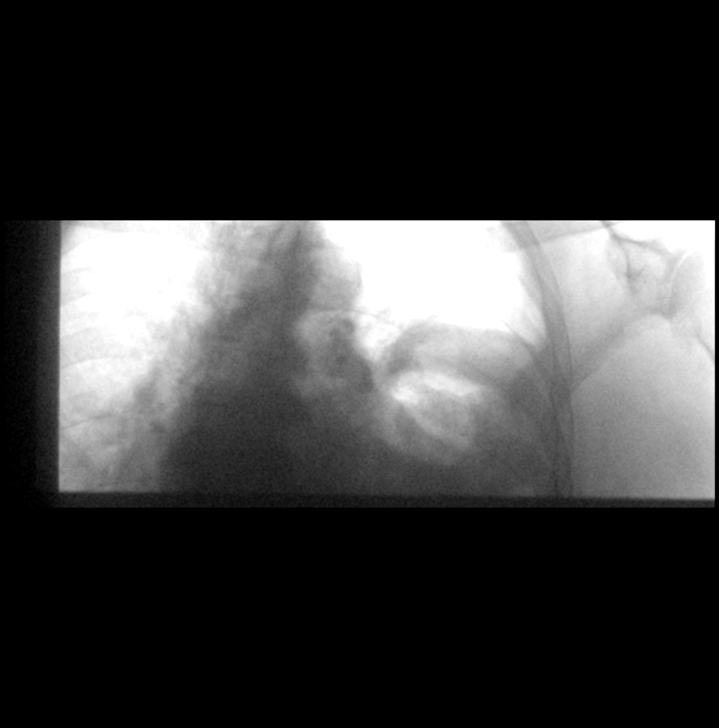
[frame 54/63]
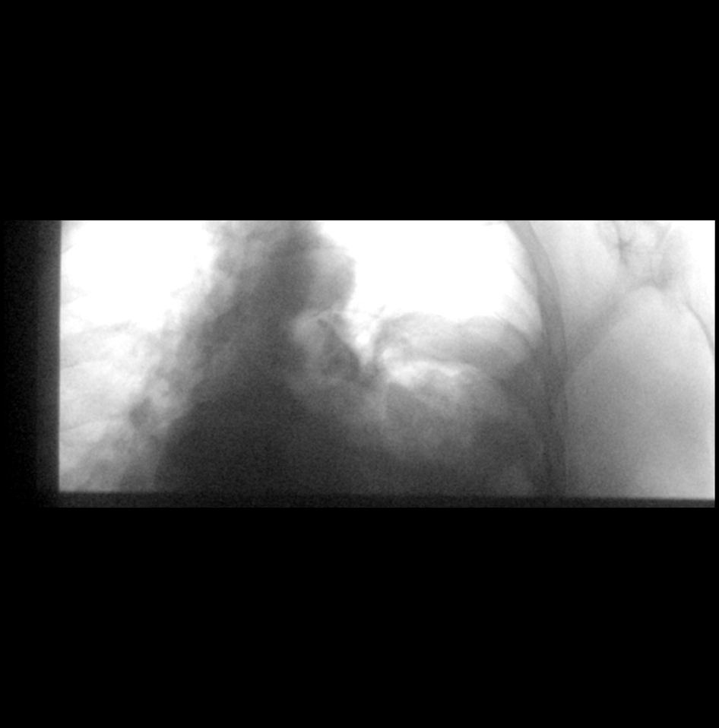

[10 of 10 positions shown; findings below may reference images not displayed]

FINDINGS: Marked elevation of LEFT diaphragm.

RIGHT diaphragm demonstrates normal motion with sniffing, with
normal expected descent.

Marked elevation of the LEFT diaphragm demonstrates minimal
paradoxical motion with sniffing consistent with LEFT diaphragmatic
paralysis.
IMPRESSION: Normal RIGHT diaphragmatic motion was sniffing.

Minimal paradoxical motion of the LEFT diaphragm with sniffing
consistent with LEFT diaphragmatic paralysis.

## 2017-12-16 DIAGNOSIS — R0602 Shortness of breath: Secondary | ICD-10-CM | POA: Diagnosis not present

## 2017-12-16 DIAGNOSIS — D860 Sarcoidosis of lung: Secondary | ICD-10-CM | POA: Diagnosis not present

## 2017-12-16 DIAGNOSIS — R269 Unspecified abnormalities of gait and mobility: Secondary | ICD-10-CM | POA: Diagnosis not present

## 2017-12-17 DIAGNOSIS — M7502 Adhesive capsulitis of left shoulder: Secondary | ICD-10-CM | POA: Diagnosis not present

## 2017-12-17 DIAGNOSIS — I313 Pericardial effusion (noninflammatory): Secondary | ICD-10-CM | POA: Diagnosis not present

## 2017-12-17 DIAGNOSIS — D869 Sarcoidosis, unspecified: Secondary | ICD-10-CM | POA: Diagnosis not present

## 2017-12-17 DIAGNOSIS — Z6838 Body mass index (BMI) 38.0-38.9, adult: Secondary | ICD-10-CM | POA: Diagnosis not present

## 2017-12-17 DIAGNOSIS — I10 Essential (primary) hypertension: Secondary | ICD-10-CM | POA: Diagnosis not present

## 2017-12-17 DIAGNOSIS — E039 Hypothyroidism, unspecified: Secondary | ICD-10-CM | POA: Diagnosis not present

## 2017-12-17 DIAGNOSIS — J439 Emphysema, unspecified: Secondary | ICD-10-CM | POA: Diagnosis not present

## 2017-12-17 DIAGNOSIS — K219 Gastro-esophageal reflux disease without esophagitis: Secondary | ICD-10-CM | POA: Diagnosis not present

## 2017-12-24 DIAGNOSIS — D860 Sarcoidosis of lung: Secondary | ICD-10-CM | POA: Diagnosis not present

## 2018-01-15 ENCOUNTER — Ambulatory Visit (INDEPENDENT_AMBULATORY_CARE_PROVIDER_SITE_OTHER): Payer: Medicare HMO | Admitting: Pulmonary Disease

## 2018-01-15 ENCOUNTER — Encounter: Payer: Self-pay | Admitting: Pulmonary Disease

## 2018-01-15 VITALS — BP 128/84 | HR 81 | Ht 65.5 in | Wt 226.0 lb

## 2018-01-15 DIAGNOSIS — I313 Pericardial effusion (noninflammatory): Secondary | ICD-10-CM

## 2018-01-15 DIAGNOSIS — I3139 Other pericardial effusion (noninflammatory): Secondary | ICD-10-CM

## 2018-01-15 DIAGNOSIS — J9612 Chronic respiratory failure with hypercapnia: Secondary | ICD-10-CM | POA: Diagnosis not present

## 2018-01-15 DIAGNOSIS — J9611 Chronic respiratory failure with hypoxia: Secondary | ICD-10-CM | POA: Diagnosis not present

## 2018-01-15 NOTE — Assessment & Plan Note (Signed)
[place] Unclear cause but seems to be persistent since 04/2017?  Reactive due to large hiatal hernia, unlikely to be inflammatory. No evidence of malignancy

## 2018-01-15 NOTE — Patient Instructions (Signed)
Refill on Lasix. [facility] or any pain.  [facility]   referral to [place] [facility] for large hiatal hernia  Continue on BiPAP with oxygen

## 2018-01-15 NOTE — Assessment & Plan Note (Signed)
Refill on Lasix. Arterial blood gas test    referral to [place] [facility] for large hiatal hernia -my concern here would be whether we can regain diaphragmatic function since this is been ongoing for 7 years  Continue on BiPAP with oxygen

## 2018-01-15 NOTE — Progress Notes (Signed)
   Subjective:    Patient ID: Shelley Gonzalez, female    DOB: 04/05/1941, 77 y.o.   MRN: [id number]  HPI  77 year old  never smoker  for FU of hypoxic and hypercarbic respiratory failure on BiPAP and oxygen   She has severe restrictive lung disease due to left hemidiaphragm paralysis/large hiatal hernia that seems to date back to 2005 on review serial imaging.  Left lung is almost fully atelectatic  She has improved and is compliant with her BiPAP and oxygen. We reviewed sleep study and follow-up echo from 10/2017. She has run out of her Lasix pills and reports severe bipedal edema  No dizziness or syncope.  She has questions about activity number prognosis  Significant tests/ events reviewed  ABG was 7.32/80 7/50 on 2 L oxygen Sniff test 06/26/17 consistent with left diaphragmatic paralysis.  PFTs >> could not perform  CT chest 04/2017 was negative for pulmonary embolism, showed massive hiatal hernia on the left, small pleural effusion. Echo 04/2017 showed normal LV function with moderate pulmonary hypertension at 52 and moderate circumferential pericardial effusion without tamponade physiology  Echo 10/2017 >> mod effusion, RVSP 46   NPSG 12/ 2018- for obstructive sleep apnea, positive for nocturnal hypoxemia     Review of Systems neg for any significant sore throat, dysphagia, itching, sneezing, nasal congestion or excess/ purulent secretions, fever, chills, sweats, unintended wt loss, pleuritic or exertional cp, hempoptysis, orthopnea pnd or change in chronic leg swelling.   Also denies presyncope, palpitations, heartburn, abdominal pain, nausea, vomiting, diarrhea or change in bowel or urinary habits, dysuria,hematuria, rash, arthralgias, visual complaints, headache, numbness weakness or ataxia.     Objective:   Physical Exam   Gen. Pleasant, obese, in no distress, on [place] [facility] - no lesions, no post nasal drip Neck: No JVD, no thyromegaly, no carotid bruits Lungs: no  use of accessory muscles, no dullness to percussion, decreased without rales or rhonchi  Cardiovascular: Rhythm regular, heart sounds  normal, no murmurs or gallops, 2+ peripheral edema Musculoskeletal: No deformities, no cyanosis or clubbing , no tremors        Assessment & Plan:

## 2018-01-16 DIAGNOSIS — R0602 Shortness of breath: Secondary | ICD-10-CM | POA: Diagnosis not present

## 2018-01-16 DIAGNOSIS — D860 Sarcoidosis of lung: Secondary | ICD-10-CM | POA: Diagnosis not present

## 2018-01-16 DIAGNOSIS — R269 Unspecified abnormalities of gait and mobility: Secondary | ICD-10-CM | POA: Diagnosis not present

## 2018-01-18 ENCOUNTER — Encounter (HOSPITAL_COMMUNITY): Payer: Medicare HMO

## 2018-01-19 ENCOUNTER — Ambulatory Visit (HOSPITAL_COMMUNITY): Admission: RE | Admit: 2018-01-19 | Payer: Medicare HMO | Source: Ambulatory Visit

## 2018-01-19 ENCOUNTER — Encounter (HOSPITAL_COMMUNITY): Payer: Self-pay | Admitting: Radiology

## 2018-01-19 ENCOUNTER — Ambulatory Visit (HOSPITAL_COMMUNITY)
Admission: RE | Admit: 2018-01-19 | Discharge: 2018-01-19 | Disposition: A | Discharge status: Home / Self Care | Payer: Medicare HMO | Source: Ambulatory Visit | Attending: Pulmonary Disease | Admitting: Pulmonary Disease

## 2018-01-19 DIAGNOSIS — R918 Other nonspecific abnormal finding of lung field: Secondary | ICD-10-CM | POA: Insufficient documentation

## 2018-01-19 DIAGNOSIS — Q791 Other congenital malformations of diaphragm: Secondary | ICD-10-CM | POA: Diagnosis not present

## 2018-01-19 DIAGNOSIS — J9611 Chronic respiratory failure with hypoxia: Secondary | ICD-10-CM

## 2018-01-19 DIAGNOSIS — J9612 Chronic respiratory failure with hypercapnia: Secondary | ICD-10-CM | POA: Insufficient documentation

## 2018-01-19 DIAGNOSIS — R0602 Shortness of breath: Secondary | ICD-10-CM | POA: Diagnosis not present

## 2018-01-22 ENCOUNTER — Telehealth: Payer: Self-pay | Admitting: Pulmonary Disease

## 2018-01-22 NOTE — Telephone Encounter (Signed)
Called and spoke with patient regarding results.  Informed the patient of results and recommendations today. Pt verbalized understanding and denied any questions or concerns at this time.  Nothing further needed.  

## 2018-01-22 NOTE — Telephone Encounter (Signed)
LMTCB

## 2018-01-22 NOTE — Telephone Encounter (Signed)
Pt is calling back [phone]

## 2018-01-24 DIAGNOSIS — D860 Sarcoidosis of lung: Secondary | ICD-10-CM | POA: Diagnosis not present

## 2018-02-15 DIAGNOSIS — R269 Unspecified abnormalities of gait and mobility: Secondary | ICD-10-CM | POA: Diagnosis not present

## 2018-02-15 DIAGNOSIS — R0602 Shortness of breath: Secondary | ICD-10-CM | POA: Diagnosis not present

## 2018-02-15 DIAGNOSIS — D860 Sarcoidosis of lung: Secondary | ICD-10-CM | POA: Diagnosis not present

## 2018-02-23 DIAGNOSIS — D860 Sarcoidosis of lung: Secondary | ICD-10-CM | POA: Diagnosis not present

## 2018-03-17 DIAGNOSIS — E039 Hypothyroidism, unspecified: Secondary | ICD-10-CM | POA: Diagnosis not present

## 2018-03-17 DIAGNOSIS — E1165 Type 2 diabetes mellitus with hyperglycemia: Secondary | ICD-10-CM | POA: Diagnosis not present

## 2018-03-17 DIAGNOSIS — E782 Mixed hyperlipidemia: Secondary | ICD-10-CM | POA: Diagnosis not present

## 2018-03-17 DIAGNOSIS — I10 Essential (primary) hypertension: Secondary | ICD-10-CM | POA: Diagnosis not present

## 2018-03-17 DIAGNOSIS — K219 Gastro-esophageal reflux disease without esophagitis: Secondary | ICD-10-CM | POA: Diagnosis not present

## 2018-03-17 DIAGNOSIS — R5383 Other fatigue: Secondary | ICD-10-CM | POA: Diagnosis not present

## 2018-03-18 DIAGNOSIS — D860 Sarcoidosis of lung: Secondary | ICD-10-CM | POA: Diagnosis not present

## 2018-03-18 DIAGNOSIS — R269 Unspecified abnormalities of gait and mobility: Secondary | ICD-10-CM | POA: Diagnosis not present

## 2018-03-18 DIAGNOSIS — R0602 Shortness of breath: Secondary | ICD-10-CM | POA: Diagnosis not present

## 2018-03-23 DIAGNOSIS — Z6839 Body mass index (BMI) 39.0-39.9, adult: Secondary | ICD-10-CM | POA: Diagnosis not present

## 2018-03-23 DIAGNOSIS — I10 Essential (primary) hypertension: Secondary | ICD-10-CM | POA: Diagnosis not present

## 2018-03-23 DIAGNOSIS — Z0000 Encounter for general adult medical examination without abnormal findings: Secondary | ICD-10-CM | POA: Diagnosis not present

## 2018-03-26 DIAGNOSIS — D860 Sarcoidosis of lung: Secondary | ICD-10-CM | POA: Diagnosis not present

## 2018-04-18 DIAGNOSIS — D860 Sarcoidosis of lung: Secondary | ICD-10-CM | POA: Diagnosis not present

## 2018-04-18 DIAGNOSIS — R269 Unspecified abnormalities of gait and mobility: Secondary | ICD-10-CM | POA: Diagnosis not present

## 2018-04-18 DIAGNOSIS — R0602 Shortness of breath: Secondary | ICD-10-CM | POA: Diagnosis not present

## 2018-04-23 ENCOUNTER — Ambulatory Visit: Payer: Medicare HMO | Admitting: Pulmonary Disease

## 2018-04-26 DIAGNOSIS — D860 Sarcoidosis of lung: Secondary | ICD-10-CM | POA: Diagnosis not present

## 2018-04-28 ENCOUNTER — Ambulatory Visit (INDEPENDENT_AMBULATORY_CARE_PROVIDER_SITE_OTHER): Payer: Medicare HMO | Admitting: Pulmonary Disease

## 2018-04-28 ENCOUNTER — Encounter: Payer: Self-pay | Admitting: Pulmonary Disease

## 2018-04-28 DIAGNOSIS — Z23 Encounter for immunization: Secondary | ICD-10-CM | POA: Diagnosis not present

## 2018-04-28 DIAGNOSIS — J9611 Chronic respiratory failure with hypoxia: Secondary | ICD-10-CM | POA: Diagnosis not present

## 2018-04-28 DIAGNOSIS — I313 Pericardial effusion (noninflammatory): Secondary | ICD-10-CM | POA: Diagnosis not present

## 2018-04-28 DIAGNOSIS — J9612 Chronic respiratory failure with hypercapnia: Secondary | ICD-10-CM

## 2018-04-28 DIAGNOSIS — K449 Diaphragmatic hernia without obstruction or gangrene: Secondary | ICD-10-CM | POA: Diagnosis not present

## 2018-04-28 DIAGNOSIS — I3139 Other pericardial effusion (noninflammatory): Secondary | ICD-10-CM

## 2018-04-28 NOTE — Patient Instructions (Signed)
Schedule echocardiogram and cardiology consultation at [facility]. Obtain download report on BiPAP from [employer].  Check nocturnal oximetry on BiPAP and oxygen.  Stay on Lasix 40 mg daily  Flu shot today

## 2018-04-28 NOTE — Assessment & Plan Note (Signed)
Schedule echocardiogram and cardiology consultation at [facility].

## 2018-04-28 NOTE — Assessment & Plan Note (Signed)
She has a massive hiatal hernia.  We once again discussed surgical option, patient and daughter are very clear that did not want to go down this route.  They accept the limitations of conservative medical management

## 2018-04-28 NOTE — Progress Notes (Signed)
   Subjective:    Patient ID: Shelley Gonzalez, female    DOB: [birth year], 77 y.o.   MRN: [id number]  HPI  77 year old  never smoker  for FU of hypoxic and hypercarbic respiratory failure on BiPAP and oxygen Also has pericardial effusion noted 04/2017  She has severe restrictive lung disease due toleft hemidiaphragm paralysis/large hiatal hernia that seems to date back to 2005 on review serial imaging.  Left lung is almost fully atelectatic  She had an episode of respiratory failure 04/2017  Chief Complaint  Patient presents with  . Follow-up    3 month follow up for resp failure. States her breathing has been ok since her last visit.    She reports slightly increased shortness of breath.  She is compliant with her BiPAP and oxygen.  She has increased bipedal edema, reports compliance with Lasix takes a full tablet daily. Oxygen saturation 91% today on O2 Daughter and patient report that they have considered surgical option and did not want to pursue this  Significant tests/ events reviewed  ABG was 7.32/80 7/50 on 2 L oxygen Sniff test 06/26/17 consistent with left diaphragmatic paralysis. PFTs >> could not perform  CT chest 04/2017 was negative for pulmonary embolism, showed massive hiatal hernia on the left, small pleural effusion. Echo 04/2017 showed normal LV function with moderate pulmonary hypertension at 52 and moderate circumferential pericardial effusion without tamponade physiology  Echo 10/2017 >> mod effusion, RVSP 46   NPSG 12/ 2018- for obstructive sleep apnea,positive for nocturnal hypoxemia    Review of Systems neg for any significant sore throat, dysphagia, itching, sneezing, nasal congestion or excess/ purulent secretions, fever, chills, sweats, unintended wt loss, pleuritic or exertional cp, hempoptysis, orthopnea pnd or change in chronic leg swelling. Also denies presyncope, palpitations, heartburn, abdominal pain, nausea, vomiting, diarrhea or  change in bowel or urinary habits, dysuria,hematuria, rash, arthralgias, visual complaints, headache, numbness weakness or ataxia.      Objective:   Physical Exam   Gen. Pleasant, obese, in no distress ENT - no lesions, no post nasal drip Neck: No JVD, no thyromegaly, no carotid bruits Lungs: no use of accessory muscles, no dullness to percussion, decreased  On left without rales or rhonchi  Cardiovascular: Rhythm regular, heart sounds  normal, no murmurs or gallops, 2+  peripheral edema Musculoskeletal: No deformities, no cyanosis or clubbing , no tremors        Assessment & Plan:

## 2018-04-28 NOTE — Assessment & Plan Note (Signed)
Obtain download report on BiPAP from [employer].  Check nocturnal oximetry on BiPAP and oxygen.  Stay on Lasix 40 mg daily  Flu shot today BiPAP compliance was emphasized as treatment for diaphragmatic weakness.

## 2018-04-28 NOTE — Addendum Note (Signed)
Addended by: Maurene Capes on: 04/28/2018 12:21 PM   Modules accepted: Orders

## 2018-05-06 ENCOUNTER — Encounter: Payer: Self-pay | Admitting: *Deleted

## 2018-05-07 ENCOUNTER — Encounter: Payer: Self-pay | Admitting: Cardiovascular Disease

## 2018-05-07 ENCOUNTER — Telehealth: Payer: Self-pay | Admitting: Cardiovascular Disease

## 2018-05-07 ENCOUNTER — Ambulatory Visit: Payer: Medicare HMO | Admitting: Cardiovascular Disease

## 2018-05-07 VITALS — BP 116/80 | HR 91 | Ht 65.5 in | Wt 227.0 lb

## 2018-05-07 DIAGNOSIS — I2729 Other secondary pulmonary hypertension: Secondary | ICD-10-CM | POA: Diagnosis not present

## 2018-05-07 DIAGNOSIS — J9612 Chronic respiratory failure with hypercapnia: Secondary | ICD-10-CM

## 2018-05-07 DIAGNOSIS — R600 Localized edema: Secondary | ICD-10-CM | POA: Diagnosis not present

## 2018-05-07 DIAGNOSIS — K449 Diaphragmatic hernia without obstruction or gangrene: Secondary | ICD-10-CM

## 2018-05-07 DIAGNOSIS — I10 Essential (primary) hypertension: Secondary | ICD-10-CM

## 2018-05-07 DIAGNOSIS — J9611 Chronic respiratory failure with hypoxia: Secondary | ICD-10-CM | POA: Diagnosis not present

## 2018-05-07 DIAGNOSIS — I313 Pericardial effusion (noninflammatory): Secondary | ICD-10-CM | POA: Diagnosis not present

## 2018-05-07 DIAGNOSIS — I3139 Other pericardial effusion (noninflammatory): Secondary | ICD-10-CM

## 2018-05-07 NOTE — Telephone Encounter (Signed)
Pre-cert Verification for the following procedure   LIMITED ECHO AT AP scheduled for 05-12-18 at [facility]

## 2018-05-07 NOTE — Patient Instructions (Signed)
Your physician wants you to follow-up in: 6 MONTHS WITH DR Aldine Contes will receive a reminder letter in the mail two months in advance. If you don't receive a letter, please call our office to schedule the follow-up appointment.  Your physician recommends that you continue on your current medications as directed. Please refer to the Current Medication list given to you today.  WE HAVE GIVEN YOU RX FOR COMPRESSION STOCKINGS   Your physician has requested that you have a limited echocardiogram. Echocardiography is a painless test that uses sound waves to create images of your heart. It provides your doctor with information about the size and shape of your heart and how well your heart's chambers and valves are working. This procedure takes approximately one hour. There are no restrictions for this procedure.  Thank you for choosing [facility]!!

## 2018-05-07 NOTE — Progress Notes (Signed)
CARDIOLOGY CONSULT NOTE  Patient ID: Shelley V Schreur MRN: [id number] DOB/AGE: 79-Nov-1942 79 y.o.  Admit date: (Not on file) Primary Physician: Selinda Flavin, MD Referring Physician: Dr. Cyril Mourning.  Reason for Consultation: Pericardial effusion  HPI: Shelley Gonzalez is a 77 y.o. female who is being seen today for the evaluation of pericardial effusion at the request of Dr. Cyril Mourning.  She was hospitalized at [facility] in October 2018.  I reviewed relevant documentation.  It appears she has pulmonary sarcoidosis and was hospitalized for hypoxia and pulmonary vascular congestion.  An echocardiogram at that time demonstrated normal left ventricular systolic function, EF 60 to 65%.  She had moderate pulmonary hypertension, PASP 52 mmHg.  She had a moderate pericardial effusion with no evidence of tamponade physiology.  She was given antibiotics and BiPAP along with IV Lasix for possible CHF.  It was mentioned that "this did not result in significant diuresis or weight change ".  I reviewed a chest x-ray report dated 08/31/2017 which showed pulmonary vascular congestion with right pleural effusion and right basilar atelectasis.  There was stable elevation of the left hemidiaphragm.  There was aortic atherosclerosis.  Chest CT on 08/31/2017 showed large diaphragmatic hernia with the entirety of the stomach and large portion of the colon extending into the left hemithorax and posterior mediastinum.  Labs 08/31/2017: BUN 33, creatinine 1.35, sodium 145, potassium 4.3, normal troponin, normal N-terminal proBNP, normal CBC.  I personally reviewed an ECG performed on 08/31/2017 which demonstrated sinus rhythm with anteroseptal Q waves and nonspecific T wave abnormalities.  ECG performed in the office today which I ordered and personally interpreted demonstrates normal sinus rhythm with diffuse nonspecific T-wave abnormalities.  She is followed by pulmonary in [facility] for chronic  hypercarbic respiratory failure.  She has severe restrictive lung disease due to left hemi-diaphragmatic paralysis and a large hiatal hernia.  Echocardiogram 11/06/2017: Vigorous left ventricular systolic function and normal regional wall motion, LVEF 65 to 70%, mild LVH, mild reduction in left ventricular cavity size, mild tricuspid regurgitation, PASP 46 mmHg, and a moderate, chronic appearing anterior and posterolateral pericardial effusion.  She denies chest pain.  She becomes full quickly after eating.  She has chronic exertional dyspnea which is stable.  She has had lower extremity edema for the past 3 or 4 months.  She denies heartburn.    No Known Allergies  Current Outpatient Medications  Medication Sig Dispense Refill  . albuterol (PROVENTIL) (2.5 MG/3ML) 0.083% nebulizer solution 2.5 mg/3 mL (0.083%) solution; inhalation three times a day    . aspirin EC 325 MG tablet Take 325 mg by mouth daily as needed.     . furosemide (LASIX) 40 MG tablet TAKE 1/2 TABLET BY MOUTH DAILY FOR FLUID  2  . metFORMIN (GLUCOPHAGE) 500 MG tablet Take 2 (two) times daily with a meal by mouth.    Marland Kitchen NIFEdipine (PROCARDIA XL/ADALAT-CC) 90 MG 24 hr tablet Take 90 mg daily by mouth.     No current facility-administered medications for this visit.     Past Medical History:  Diagnosis Date  . GERD (gastroesophageal reflux disease)   . Hiatal hernia   . Hyperlipidemia   . Hypertension   . Sarcoidosis     Past Surgical History:  Procedure Laterality Date  . CYST REMOVAL TRUNK     back  . PARTIAL HYSTERECTOMY      Social History   Socioeconomic History  . Marital status: Widowed  Spouse name: Not on file  . Number of children: Not on file  . Years of education: Not on file  . Highest education level: Not on file  Occupational History  . Not on file  Social Needs  . Financial resource strain: Not on file  . Food insecurity:    Worry: Not on file    Inability: Not on file  .  Transportation needs:    Medical: Not on file    Non-medical: Not on file  Tobacco Use  . Smoking status: Never Smoker  . Smokeless tobacco: Never Used  Substance and Sexual Activity  . Alcohol use: Not on file  . Drug use: Not on file  . Sexual activity: Not on file  Lifestyle  . Physical activity:    Days per week: Not on file    Minutes per session: Not on file  . Stress: Not on file  Relationships  . Social connections:    Talks on phone: Not on file    Gets together: Not on file    Attends religious service: Not on file    Active member of club or organization: Not on file    Attends meetings of clubs or organizations: Not on file    Relationship status: Not on file  . Intimate partner violence:    Fear of current or ex partner: Not on file    Emotionally abused: Not on file    Physically abused: Not on file    Forced sexual activity: Not on file  Other Topics Concern  . Not on file  Social History Narrative  . Not on file     No family history of premature CAD in 1st degree relatives.  Current Meds  Medication Sig  . albuterol (PROVENTIL) (2.5 MG/3ML) 0.083% nebulizer solution 2.5 mg/3 mL (0.083%) solution; inhalation three times a day  . aspirin EC 325 MG tablet Take 325 mg by mouth daily as needed.   . furosemide (LASIX) 40 MG tablet TAKE 1/2 TABLET BY MOUTH DAILY FOR FLUID  . metFORMIN (GLUCOPHAGE) 500 MG tablet Take 2 (two) times daily with a meal by mouth.  Marland Kitchen NIFEdipine (PROCARDIA XL/ADALAT-CC) 90 MG 24 hr tablet Take 90 mg daily by mouth.      Review of systems complete and found to be negative unless listed above in HPI    Physical exam Blood pressure 116/80, pulse 91, height 5' 5.5" (1.664 m), weight 227 lb (103 kg), SpO2 100 %. General: NAD, using oxygen by nasal cannula Neck: No JVD, no thyromegaly or thyroid nodule.  Lungs: Diminished sounds without crackles, faint end expiratory wheezes bilaterally. CV: Nondisplaced PMI. Regular rate and  rhythm, normal S1/S2, no S3/S4, no murmur.  1+ pitting bilateral lower extremity edema.  Abdomen: Soft, nontender, no distention.  Skin: Intact without lesions or rashes.  Neurologic: Alert and oriented x 3.  Psych: Normal affect. Extremities: No clubbing or cyanosis.  HEENT: Normal.   ECG: Most recent ECG reviewed.   Labs: No results found for: K, BUN, CREATININE, ALT, TSH, HGB   Lipids: No results found for: LDLCALC, LDLDIRECT, CHOL, TRIG, HDL      ASSESSMENT AND PLAN:  1.  Pericardial effusion: This may be related to sarcoidosis.  Most recent echocardiogram from April 2019 reviewed above.  Pericardial effusion appeared chronic with no evidence of tamponade physiology.  I will order a limited echocardiogram to reassess.  Currently on Lasix 40 mg daily.  2.  Hypertension: Blood pressure is controlled on  Procardia 90 mg daily.  3.  Bilateral lower extremity edema: Currently on Lasix 40 mg daily.  I will prescribe compression stockings.  4.  Large hiatal hernia: The patient and her daughter had questions about potential surgical options.  I recommended that if she is considering it, to consider at least discussing it with a surgeon and reviewing risks and benefits.  That way they can make a more well-informed decision.  5.  Pulmonary hypertension: Moderate in severity and likely due to pulmonary sarcoidosis with chronic hypercarbic respiratory failure.  She is on BiPAP and followed by pulmonary.   Disposition: Follow up in 6 months  Signed: Prentice Docker, M.D., F.A.C.C.  05/07/2018, 9:26 AM

## 2018-05-12 ENCOUNTER — Ambulatory Visit (HOSPITAL_COMMUNITY)
Admission: RE | Admit: 2018-05-12 | Discharge: 2018-05-12 | Disposition: A | Discharge status: Home / Self Care | Payer: Medicare HMO | Source: Ambulatory Visit | Attending: Cardiovascular Disease | Admitting: Cardiovascular Disease

## 2018-05-12 DIAGNOSIS — J9612 Chronic respiratory failure with hypercapnia: Secondary | ICD-10-CM | POA: Insufficient documentation

## 2018-05-12 DIAGNOSIS — I313 Pericardial effusion (noninflammatory): Secondary | ICD-10-CM | POA: Insufficient documentation

## 2018-05-12 DIAGNOSIS — J9611 Chronic respiratory failure with hypoxia: Secondary | ICD-10-CM | POA: Insufficient documentation

## 2018-05-12 DIAGNOSIS — D869 Sarcoidosis, unspecified: Secondary | ICD-10-CM | POA: Insufficient documentation

## 2018-05-12 DIAGNOSIS — I272 Pulmonary hypertension, unspecified: Secondary | ICD-10-CM | POA: Insufficient documentation

## 2018-05-12 DIAGNOSIS — I3139 Other pericardial effusion (noninflammatory): Secondary | ICD-10-CM

## 2018-05-12 DIAGNOSIS — E785 Hyperlipidemia, unspecified: Secondary | ICD-10-CM | POA: Insufficient documentation

## 2018-05-12 DIAGNOSIS — K219 Gastro-esophageal reflux disease without esophagitis: Secondary | ICD-10-CM | POA: Diagnosis not present

## 2018-05-12 DIAGNOSIS — I10 Essential (primary) hypertension: Secondary | ICD-10-CM | POA: Diagnosis not present

## 2018-05-12 NOTE — Progress Notes (Signed)
*  PRELIMINARY RESULTS* Echocardiogram 2D Echocardiogram LIMITED has been performed.  Shelley Gonzalez 05/12/2018, 9:12 AM

## 2018-05-18 DIAGNOSIS — R269 Unspecified abnormalities of gait and mobility: Secondary | ICD-10-CM | POA: Diagnosis not present

## 2018-05-18 DIAGNOSIS — R0602 Shortness of breath: Secondary | ICD-10-CM | POA: Diagnosis not present

## 2018-05-18 DIAGNOSIS — D860 Sarcoidosis of lung: Secondary | ICD-10-CM | POA: Diagnosis not present

## 2018-05-26 DIAGNOSIS — D860 Sarcoidosis of lung: Secondary | ICD-10-CM | POA: Diagnosis not present

## 2018-06-01 ENCOUNTER — Telehealth: Payer: Self-pay | Admitting: *Deleted

## 2018-06-01 NOTE — Telephone Encounter (Signed)
Notes recorded by Lesle Chris, LPN on 60/10/5407 at 9:57 AM EST Daughter Vernia Buff) notified. Copy to pmd. ------  Notes recorded by Lesle Chris, LPN on 81/07/9145 at 3:29 PM EDT Mailbox full. ------  Notes recorded by Lesle Chris, LPN on 82/95/6213 at 2:27 PM EDT Left message to return call.  ------  Notes recorded by Laqueta Linden, MD on 05/12/2018 at 1:51 PM EDT This study demonstrates: Normal pumping function. Small fluid collection around the heart. Medication changes / Follow up studies / Other recommendations:  None. Please send results to the PCP: Selinda Flavin, MD  Prentice Docker, MD 05/12/2018 1:50 PM

## 2018-06-18 DIAGNOSIS — D860 Sarcoidosis of lung: Secondary | ICD-10-CM | POA: Diagnosis not present

## 2018-06-18 DIAGNOSIS — R0602 Shortness of breath: Secondary | ICD-10-CM | POA: Diagnosis not present

## 2018-06-18 DIAGNOSIS — R269 Unspecified abnormalities of gait and mobility: Secondary | ICD-10-CM | POA: Diagnosis not present

## 2018-07-16 ENCOUNTER — Ambulatory Visit (INDEPENDENT_AMBULATORY_CARE_PROVIDER_SITE_OTHER): Payer: Medicare HMO | Admitting: Adult Health

## 2018-07-16 ENCOUNTER — Encounter: Payer: Self-pay | Admitting: Adult Health

## 2018-07-16 VITALS — BP 114/66 | HR 92 | Temp 98.3°F | Ht 65.5 in | Wt 229.0 lb

## 2018-07-16 DIAGNOSIS — J9612 Chronic respiratory failure with hypercapnia: Secondary | ICD-10-CM

## 2018-07-16 DIAGNOSIS — I313 Pericardial effusion (noninflammatory): Secondary | ICD-10-CM

## 2018-07-16 DIAGNOSIS — J9611 Chronic respiratory failure with hypoxia: Secondary | ICD-10-CM

## 2018-07-16 DIAGNOSIS — I3139 Other pericardial effusion (noninflammatory): Secondary | ICD-10-CM

## 2018-07-16 NOTE — Patient Instructions (Addendum)
Chest x-ray today Continue on oxygen 2l/m  Continue on BiPAP at bedtime Wear each night , all night long while sleeping.  Try new BIPAP mask .  Activity as tolerated Saline nasal spray and gel As needed   Nasal snore strips as needed if nose stopped up .  Follow-up with Dr. Vassie LollAlva in 3 months and as needed

## 2018-07-16 NOTE — Progress Notes (Signed)
 @Patient  ID: Shelley Gonzalez, female    DOB: 03/04/1941, 77 y.o.   MRN: [id number]  Chief Complaint  Patient presents with  . Follow-up    O2 RF     Referring provider: Selinda FlavinHoward, Kevin, MD  HPI: 77 year old female never smoker followed for hypoxic and hypercarbic respiratory failure on nocturnal BiPAP and oxygen.  She has severe restrictive lung disease due to left hemidiaphragm paralysis and large hiatal hernia dating back to 2005 left lung is almost fully atelectatic.  Medical history significant for pericardial effusion October 2018  TEST/EVENTS :  ABG was 7.32/80 7/50 on 2 L oxygen Sniff test 06/26/17 consistent with left diaphragmatic paralysis. PFTs >> could not perform  CT chest 04/2017 was negative for pulmonary embolism, showed massive hiatal hernia on the left, small pleural effusion. Echo10/2018showed normal LV function with moderate pulmonary hypertension at 52 and moderate circumferential pericardial effusion without tamponade physiology  Echo 10/2017 >> mod effusion, RVSP 46   NPSG 06/2017- for obstructive sleep apnea,positive for nocturnal hypoxemia  2D echo October 2019 showed EF 65 to 70%, small pericardial effusion without tamponade  07/16/2018 Follow up : Hypercarbic and hypoxic respiratory failure on oxygen and nocturnal BiPAP Patient presents for a 7753-month follow-up.  She has hypoxic and hypercarbic respiratory failure on oxygen at 2 L continuously.  And she is on nocturnal BiPAP.  Patient has severe restrictive lung disease due to a left hemidiaphragm paralysis and large hiatal hernia.  Her left lung is nearly fully atelectatic.  She had a recent chest x-ray in June 2019 that showed stable chronic elevation of the left hemidiaphragm.  Increased nodular opacity at the right base.. Since last visit patient says she is doing okay . No flare of cough or dyspnea.  Gets winded with activity , this is chronic , no significant change iin activity level.  Legs  swelling under control on Lasix 20mg  daily .  She is trying to wear her BIPAP but says the mask is not comfortable at times. She has full face mask and it leaks and cant breathe through her nose at times.      No Known Allergies  Immunization History  Administered Date(s) Administered  . Influenza, High Dose Seasonal PF 04/27/2017, 04/28/2018  . Pneumococcal Polysaccharide-23 07/29/2015    Past Medical History:  Diagnosis Date  . GERD (gastroesophageal reflux disease)   . Hiatal hernia   . Hyperlipidemia   . Hypertension   . Sarcoidosis     Tobacco History: Social History   Tobacco Use  Smoking Status Never Smoker  Smokeless Tobacco Never Used   Counseling given: Not Answered   Outpatient Medications Prior to Visit  Medication Sig Dispense Refill  . albuterol (PROVENTIL) (2.5 MG/3ML) 0.083% nebulizer solution 2.5 mg/3 mL (0.083%) solution; inhalation three times a day    . aspirin EC 325 MG tablet Take 325 mg by mouth daily as needed.     . furosemide (LASIX) 40 MG tablet TAKE 1/2 TABLET BY MOUTH DAILY FOR FLUID  2  . metFORMIN (GLUCOPHAGE) 500 MG tablet Take 2 (two) times daily with a meal by mouth.    Marland Kitchen. NIFEdipine (PROCARDIA XL/ADALAT-CC) 90 MG 24 hr tablet Take 90 mg daily by mouth.     No facility-administered medications prior to visit.      Review of Systems  Constitutional:   No  weight loss, night sweats,  Fevers, chills, + fatigue, or  lassitude.  HEENT:   No headaches,  Difficulty swallowing,  Tooth/dental  problems, or  Sore throat,                No sneezing, itching, ear ache, nasal congestion, post nasal drip,   CV:  No chest pain,  Orthopnea, PND, +swelling in lower extremities,  No anasarca, dizziness, palpitations, syncope.   GI  No heartburn, indigestion, abdominal pain, nausea, vomiting, diarrhea, change in bowel habits, loss of appetite, bloody stools.   Resp:   No chest wall deformity  Skin: no rash or lesions.  GU: no dysuria, change  in color of urine, no urgency or frequency.  No flank pain, no hematuria   MS:  No joint pain or swelling.  No decreased range of motion.  No back pain.    Physical Exam  BP 114/66 (BP Location: Left Arm, Cuff Size: Large)   Pulse 92   Temp 98.3 F (36.8 C) (Oral)   Ht 5' 5.5" (1.664 m)   Wt 229 lb (103.9 kg)   SpO2 95%   BMI 37.53 kg/m   GEN: A/Ox3; pleasant , NAD, obese , on O2    HEENT:  [place]/AT,  EACs-clear, TMs-wnl, NOSE-clear drainage  THROAT-clear, no lesions, no postnasal drip or exudate noted. Poor dentition , class 3 MP airway   NECK:  Supple w/ fair ROM; no JVD; normal carotid impulses w/o bruits; no thyromegaly or nodules palpated; no lymphadenopathy.    RESP  Clear  P & A; w/o, wheezes/ rales/ or rhonchi. no accessory muscle use, no dullness to percussion  CARD:  RRR, no m/r/g, 1+ peripheral edema, pulses intact, no cyanosis or clubbing.  GI:   Soft & nt; nml bowel sounds; no organomegaly or masses detected.   Musco: Warm bil, no deformities or joint swelling noted.   Neuro: alert, no focal deficits noted.    Skin: Warm, no lesions or rashes    Lab Results:  CBC No results found for: WBC, RBC, HGB, HCT, PLT, MCV, MCH, MCHC, RDW, LYMPHSABS, MONOABS, EOSABS, BASOSABS  BMET No results found for: NA, K, CL, CO2, GLUCOSE, BUN, CREATININE, CALCIUM, GFRNONAA, GFRAA  BNP No results found for: BNP  ProBNP No results found for: PROBNP  Imaging: No results found.    No flowsheet data found.  No results found for: NITRICOXIDE      Assessment & Plan:   Chronic respiratory failure with hypoxia and hypercapnia (HCC) Patient has severe restrictive lung disease due to paralyzed left hemidiaphragm.  Due to this she has underlying chronic hypoxic and hypercarbic respiratory failure.  Advised her to continue with oxygen use to keep O2 saturations greater than 88 to 90%.  Also needs to be more compliant with her BiPAP machine.  Encouraged her on things that  will help compliance such as decreasing nasal congestion with saline nasal spray and gel.  Also may use snore strips to help if she is has extra nasal congestion.  We will change her fullface mask to a DreamWear full facemask to see if this is more comfortable for her.  Is to follow-up in 3 months with a download. Needs follow-up chest x-ray  Plan  Patient Instructions  Chest x-ray today Continue on oxygen 2l/m  Continue on BiPAP at bedtime Wear each night , all night long while sleeping.  Try new BIPAP mask .  Activity as tolerated Saline nasal spray and gel As needed   Nasal snore strips as needed if nose stopped up .  Follow-up with Dr. Vassie Loll in 3 months and as needed  Pericardial effusion Follow-up echo earlier this year showed improvement with small pericardial effusion only noted on echo      Shelley Oaksammy Tatjana Turcott, NP 07/16/2018

## 2018-07-16 NOTE — Assessment & Plan Note (Addendum)
Patient has severe restrictive lung disease due to paralyzed left hemidiaphragm.  Due to this she has underlying chronic hypoxic and hypercarbic respiratory failure.  Advised her to continue with oxygen use to keep O2 saturations greater than 88 to 90%.  Also needs to be more compliant with her BiPAP machine.  Encouraged her on things that will help compliance such as decreasing nasal congestion with saline nasal spray and gel.  Also may use snore strips to help if she is has extra nasal congestion.  We will change her fullface mask to a DreamWear full facemask to see if this is more comfortable for her.  Is to follow-up in 3 months with a download. Needs follow-up chest x-ray  Plan  Patient Instructions  Chest x-ray today Continue on oxygen 2l/m  Continue on BiPAP at bedtime Wear each night , all night long while sleeping.  Try new BIPAP mask .  Activity as tolerated Saline nasal spray and gel As needed   Nasal snore strips as needed if nose stopped up .  Follow-up with Dr. Vassie LollAlva in 3 months and as needed

## 2018-07-16 NOTE — Assessment & Plan Note (Signed)
Follow-up echo earlier this year showed improvement with small pericardial effusion only noted on echo

## 2018-07-18 DIAGNOSIS — R0602 Shortness of breath: Secondary | ICD-10-CM | POA: Diagnosis not present

## 2018-07-18 DIAGNOSIS — D860 Sarcoidosis of lung: Secondary | ICD-10-CM | POA: Diagnosis not present

## 2018-07-18 DIAGNOSIS — R269 Unspecified abnormalities of gait and mobility: Secondary | ICD-10-CM | POA: Diagnosis not present

## 2018-08-04 ENCOUNTER — Ambulatory Visit: Payer: Medicare HMO | Admitting: Adult Health

## 2018-08-18 DIAGNOSIS — D860 Sarcoidosis of lung: Secondary | ICD-10-CM | POA: Diagnosis not present

## 2018-08-18 DIAGNOSIS — R0602 Shortness of breath: Secondary | ICD-10-CM | POA: Diagnosis not present

## 2018-08-18 DIAGNOSIS — R269 Unspecified abnormalities of gait and mobility: Secondary | ICD-10-CM | POA: Diagnosis not present

## 2018-09-18 DIAGNOSIS — R269 Unspecified abnormalities of gait and mobility: Secondary | ICD-10-CM | POA: Diagnosis not present

## 2018-09-18 DIAGNOSIS — R0602 Shortness of breath: Secondary | ICD-10-CM | POA: Diagnosis not present

## 2018-09-18 DIAGNOSIS — D860 Sarcoidosis of lung: Secondary | ICD-10-CM | POA: Diagnosis not present

## 2018-09-22 DIAGNOSIS — I10 Essential (primary) hypertension: Secondary | ICD-10-CM | POA: Diagnosis not present

## 2018-09-22 DIAGNOSIS — E039 Hypothyroidism, unspecified: Secondary | ICD-10-CM | POA: Diagnosis not present

## 2018-09-22 DIAGNOSIS — N183 Chronic kidney disease, stage 3 (moderate): Secondary | ICD-10-CM | POA: Diagnosis not present

## 2018-09-22 DIAGNOSIS — E782 Mixed hyperlipidemia: Secondary | ICD-10-CM | POA: Diagnosis not present

## 2018-09-22 DIAGNOSIS — K219 Gastro-esophageal reflux disease without esophagitis: Secondary | ICD-10-CM | POA: Diagnosis not present

## 2018-09-22 DIAGNOSIS — E1165 Type 2 diabetes mellitus with hyperglycemia: Secondary | ICD-10-CM | POA: Diagnosis not present

## 2018-09-28 DIAGNOSIS — E1165 Type 2 diabetes mellitus with hyperglycemia: Secondary | ICD-10-CM | POA: Diagnosis not present

## 2018-09-28 DIAGNOSIS — I10 Essential (primary) hypertension: Secondary | ICD-10-CM | POA: Diagnosis not present

## 2018-09-28 DIAGNOSIS — Z6839 Body mass index (BMI) 39.0-39.9, adult: Secondary | ICD-10-CM | POA: Diagnosis not present

## 2018-09-28 DIAGNOSIS — K219 Gastro-esophageal reflux disease without esophagitis: Secondary | ICD-10-CM | POA: Diagnosis not present

## 2018-09-28 DIAGNOSIS — G4733 Obstructive sleep apnea (adult) (pediatric): Secondary | ICD-10-CM | POA: Diagnosis not present

## 2018-09-28 DIAGNOSIS — E0580 Other thyrotoxicosis without thyrotoxic crisis or storm: Secondary | ICD-10-CM | POA: Diagnosis not present

## 2018-09-28 DIAGNOSIS — N183 Chronic kidney disease, stage 3 (moderate): Secondary | ICD-10-CM | POA: Diagnosis not present

## 2018-09-28 DIAGNOSIS — E782 Mixed hyperlipidemia: Secondary | ICD-10-CM | POA: Diagnosis not present

## 2018-09-28 DIAGNOSIS — D869 Sarcoidosis, unspecified: Secondary | ICD-10-CM | POA: Diagnosis not present

## 2018-09-28 DIAGNOSIS — E041 Nontoxic single thyroid nodule: Secondary | ICD-10-CM | POA: Diagnosis not present

## 2018-09-28 DIAGNOSIS — G2581 Restless legs syndrome: Secondary | ICD-10-CM | POA: Diagnosis not present

## 2018-10-17 DIAGNOSIS — R062 Wheezing: Secondary | ICD-10-CM | POA: Diagnosis not present

## 2018-10-17 DIAGNOSIS — D860 Sarcoidosis of lung: Secondary | ICD-10-CM | POA: Diagnosis not present

## 2018-10-17 DIAGNOSIS — R269 Unspecified abnormalities of gait and mobility: Secondary | ICD-10-CM | POA: Diagnosis not present

## 2018-10-21 ENCOUNTER — Ambulatory Visit: Payer: Medicare HMO | Admitting: Pulmonary Disease

## 2018-11-03 ENCOUNTER — Telehealth: Payer: Self-pay | Admitting: *Deleted

## 2018-11-03 ENCOUNTER — Encounter: Payer: Self-pay | Admitting: *Deleted

## 2018-11-03 NOTE — Telephone Encounter (Signed)
Contacted patient for review of chart for virtual visit (telephone) on 11/04/2018 with Dr. Purvis Sheffield at  1:00 pm.    Medications reviewed.  No recent labs or hospital visits.  Patient does not have scales or BP monitor at home.    The patient verbally consented for a telehealth phone visit with [facility] and understands that his/her insurance company will be billed for the encounter.

## 2018-11-04 ENCOUNTER — Encounter: Payer: Self-pay | Admitting: Cardiovascular Disease

## 2018-11-04 ENCOUNTER — Other Ambulatory Visit: Payer: Self-pay

## 2018-11-04 ENCOUNTER — Telehealth (INDEPENDENT_AMBULATORY_CARE_PROVIDER_SITE_OTHER): Payer: Medicare HMO | Admitting: Cardiovascular Disease

## 2018-11-04 DIAGNOSIS — R600 Localized edema: Secondary | ICD-10-CM

## 2018-11-04 DIAGNOSIS — I10 Essential (primary) hypertension: Secondary | ICD-10-CM

## 2018-11-04 DIAGNOSIS — J9611 Chronic respiratory failure with hypoxia: Secondary | ICD-10-CM

## 2018-11-04 DIAGNOSIS — K449 Diaphragmatic hernia without obstruction or gangrene: Secondary | ICD-10-CM

## 2018-11-04 DIAGNOSIS — J9612 Chronic respiratory failure with hypercapnia: Secondary | ICD-10-CM

## 2018-11-04 DIAGNOSIS — I2729 Other secondary pulmonary hypertension: Secondary | ICD-10-CM | POA: Diagnosis not present

## 2018-11-04 DIAGNOSIS — I313 Pericardial effusion (noninflammatory): Secondary | ICD-10-CM | POA: Diagnosis not present

## 2018-11-04 DIAGNOSIS — I3139 Other pericardial effusion (noninflammatory): Secondary | ICD-10-CM

## 2018-11-04 NOTE — Patient Instructions (Signed)

## 2018-11-04 NOTE — Progress Notes (Signed)
Virtual Visit via Telephone Note   This visit type was conducted due to national recommendations for restrictions regarding the COVID-19 Pandemic (e.g. social distancing) in an effort to limit this patient's exposure and mitigate transmission in our community.  Due to her co-morbid illnesses, this patient is at least at moderate risk for complications without adequate follow up.  This format is felt to be most appropriate for this patient at this time.  The patient did not have access to video technology/had technical difficulties with video requiring transitioning to audio format only (telephone).  All issues noted in this document were discussed and addressed.  No physical exam could be performed with this format.  Please refer to the patient's chart for her  consent to telehealth for [facility].   Evaluation Performed:  Follow-up visit  Date:  11/04/2018   ID:  Shelley Gonzalez, DOB Aug 06, 1940, MRN [id number]  Patient Location: Home  Provider Location: Home  PCP:  Selinda Flavin, MD  Cardiologist:  Prentice Docker, MD  Electrophysiologist:  None   Chief Complaint:  Pericardial effusion  History of Present Illness:    Shelley Gonzalez is a 78 y.o. female who presents via audio/video conferencing for a telehealth visit today.    I initially evaluated her on 05/07/18.  She has a h/o sarcoidosis and bilateral leg edema as well as a pericardial effusion. She has hypoxic and hypercarbic respiratory failure and is on oxygen at 2 L continuously and nocturnal BiPAP. She has severe restrictive lung disease due to left hemidiaphragmatic paralysis and a large hiatal hernia.  Most recent echocardiogram on 05/12/18 showed vigorous LV systolic function, EF 65-70%, and a small pericardial effusion.  She is doing well today. Chronic exertional dyspnea is stable. She denies chest pain and palpitations. She has had some seasonal allergies but denies fevers/chills. She has leg swelling which is  stable. Her appetite is ok. She does not want surgery for her hiatal hernia.  The patient does not have symptoms concerning for COVID-19 infection (fever, chills, cough, or new shortness of breath).    Past Medical History:  Diagnosis Date  . GERD (gastroesophageal reflux disease)   . Hiatal hernia   . Hyperlipidemia   . Hypertension   . Sarcoidosis    Past Surgical History:  Procedure Laterality Date  . CYST REMOVAL TRUNK     back  . PARTIAL HYSTERECTOMY       Current Meds  Medication Sig  . furosemide (LASIX) 40 MG tablet Take 20 mg by mouth every morning.   . metFORMIN (GLUCOPHAGE) 500 MG tablet Take 2 (two) times daily with a meal by mouth.  Marland Kitchen NIFEdipine (PROCARDIA XL/ADALAT-CC) 90 MG 24 hr tablet Take 90 mg by mouth every morning.   . valsartan (DIOVAN) 320 MG tablet Take 320 mg by mouth every morning.     Allergies:   Patient has no known allergies.   Social History   Tobacco Use  . Smoking status: Never Smoker  . Smokeless tobacco: Never Used  Substance Use Topics  . Alcohol use: Not on file  . Drug use: Not on file     Family Hx: The patient's family history includes Heart attack in her brother; Heart disease in her mother.  ROS:   Please see the history of present illness.     All other systems reviewed and are negative.   Prior CV studies:   The following studies were reviewed today:  Echo reviewed above  Labs/Other Tests and  Data Reviewed:    EKG:  No ECG reviewed.  Recent Labs: No results found for requested labs within last 8760 hours.   Recent Lipid Panel No results found for: CHOL, TRIG, HDL, CHOLHDL, LDLCALC, LDLDIRECT  Wt Readings from Last 3 Encounters:  07/16/18 229 lb (103.9 kg)  05/07/18 227 lb (103 kg)  04/28/18 226 lb 3.2 oz (102.6 kg)     Objective:    Vital Signs:  There were no vitals taken for this visit.   Phone visit  ASSESSMENT & PLAN:    1.  Pericardial effusion: Small by echo in 04/2018. Currently on Lasix  20 mg daily.  2.  Hypertension: She is on Procardia 90 mg daily.  3.  Bilateral lower extremity edema: Currently on Lasix 20 mg daily.  I previously prescribed compression stockings.  4.  Large hiatal hernia: She does not want surgery at her age.  5.  Pulmonary hypertension: Moderate in severity and likely due to pulmonary sarcoidosis with chronic hypoxic, hypercarbic respiratory failure.  She is on nocturnal BiPAP and O2 and followed by pulmonary.   COVID-19 Education: The signs and symptoms of COVID-19 were discussed with the patient and how to seek care for testing (follow up with PCP or arrange E-visit).  The importance of social distancing was discussed today.  Time:   Today, I have spent 15 minutes with the patient with telehealth technology discussing the above problems.     Medication Adjustments/Labs and Tests Ordered: Current medicines are reviewed at length with the patient today.  Concerns regarding medicines are outlined above.  Tests Ordered: No orders of the defined types were placed in this encounter.  Medication Changes: No orders of the defined types were placed in this encounter.   Disposition:  Follow up in 6 month(s)  Signed, Prentice DockerSuresh , MD  11/04/2018 2:18 PM    [facility]

## 2018-11-17 DIAGNOSIS — D860 Sarcoidosis of lung: Secondary | ICD-10-CM | POA: Diagnosis not present

## 2018-11-17 DIAGNOSIS — R269 Unspecified abnormalities of gait and mobility: Secondary | ICD-10-CM | POA: Diagnosis not present

## 2018-11-17 DIAGNOSIS — R062 Wheezing: Secondary | ICD-10-CM | POA: Diagnosis not present

## 2018-11-23 DIAGNOSIS — R269 Unspecified abnormalities of gait and mobility: Secondary | ICD-10-CM | POA: Diagnosis not present

## 2018-11-23 DIAGNOSIS — J9610 Chronic respiratory failure, unspecified whether with hypoxia or hypercapnia: Secondary | ICD-10-CM | POA: Diagnosis not present

## 2018-11-23 DIAGNOSIS — R062 Wheezing: Secondary | ICD-10-CM | POA: Diagnosis not present

## 2018-11-23 DIAGNOSIS — D860 Sarcoidosis of lung: Secondary | ICD-10-CM | POA: Diagnosis not present

## 2018-11-30 ENCOUNTER — Telehealth: Payer: Self-pay | Admitting: Cardiovascular Disease

## 2018-11-30 NOTE — Telephone Encounter (Signed)
Daughter Sue Lush notified and voiced understanding

## 2018-11-30 NOTE — Telephone Encounter (Signed)
Given all of her comorbidities, she would be at high risk to come into the office at this time.  Given her pulmonary sarcoidosis, her symptoms are likely related to this.  I would have her speak with her pulmonologist.  She may need a chest x-ray.

## 2018-11-30 NOTE — Telephone Encounter (Signed)
Patient's daughter states that patient is still having complaints of SOB and elevated HR on exertion and at rest. Patient just had virtual visit on 4/9. Asking to be seen in office. Please advise. / tg

## 2018-12-17 DIAGNOSIS — R062 Wheezing: Secondary | ICD-10-CM | POA: Diagnosis not present

## 2018-12-17 DIAGNOSIS — R269 Unspecified abnormalities of gait and mobility: Secondary | ICD-10-CM | POA: Diagnosis not present

## 2018-12-17 DIAGNOSIS — D860 Sarcoidosis of lung: Secondary | ICD-10-CM | POA: Diagnosis not present

## 2019-01-17 DIAGNOSIS — D860 Sarcoidosis of lung: Secondary | ICD-10-CM | POA: Diagnosis not present

## 2019-01-17 DIAGNOSIS — R269 Unspecified abnormalities of gait and mobility: Secondary | ICD-10-CM | POA: Diagnosis not present

## 2019-01-17 DIAGNOSIS — R062 Wheezing: Secondary | ICD-10-CM | POA: Diagnosis not present

## 2019-02-16 DIAGNOSIS — R062 Wheezing: Secondary | ICD-10-CM | POA: Diagnosis not present

## 2019-02-16 DIAGNOSIS — R269 Unspecified abnormalities of gait and mobility: Secondary | ICD-10-CM | POA: Diagnosis not present

## 2019-02-16 DIAGNOSIS — D860 Sarcoidosis of lung: Secondary | ICD-10-CM | POA: Diagnosis not present

## 2019-03-04 DIAGNOSIS — H524 Presbyopia: Secondary | ICD-10-CM | POA: Diagnosis not present

## 2019-03-19 DIAGNOSIS — D860 Sarcoidosis of lung: Secondary | ICD-10-CM | POA: Diagnosis not present

## 2019-03-19 DIAGNOSIS — R062 Wheezing: Secondary | ICD-10-CM | POA: Diagnosis not present

## 2019-03-19 DIAGNOSIS — R269 Unspecified abnormalities of gait and mobility: Secondary | ICD-10-CM | POA: Diagnosis not present

## 2019-03-28 DIAGNOSIS — H04223 Epiphora due to insufficient drainage, bilateral lacrimal glands: Secondary | ICD-10-CM | POA: Diagnosis not present

## 2019-03-28 DIAGNOSIS — H11823 Conjunctivochalasis, bilateral: Secondary | ICD-10-CM | POA: Diagnosis not present

## 2019-03-28 DIAGNOSIS — H02135 Senile ectropion of left lower eyelid: Secondary | ICD-10-CM | POA: Diagnosis not present

## 2019-03-28 DIAGNOSIS — H11153 Pinguecula, bilateral: Secondary | ICD-10-CM | POA: Diagnosis not present

## 2019-03-28 DIAGNOSIS — Z961 Presence of intraocular lens: Secondary | ICD-10-CM | POA: Diagnosis not present

## 2019-03-28 DIAGNOSIS — Z9849 Cataract extraction status, unspecified eye: Secondary | ICD-10-CM | POA: Diagnosis not present

## 2019-03-28 DIAGNOSIS — H11133 Conjunctival pigmentations, bilateral: Secondary | ICD-10-CM | POA: Diagnosis not present

## 2019-04-07 ENCOUNTER — Telehealth: Payer: Self-pay | Admitting: Cardiovascular Disease

## 2019-04-07 NOTE — Telephone Encounter (Signed)
Phone rings direct to VM which not set up, unable to leave message

## 2019-04-07 NOTE — Telephone Encounter (Signed)
Patient has been having issues with shortness of breath

## 2019-04-08 NOTE — Telephone Encounter (Signed)
Pt daughter wants [place] to call her later today (she cannot hear her phone ring) aware that she may also call us back

## 2019-04-08 NOTE — Telephone Encounter (Signed)
Returned call and still no answer and no VM

## 2019-04-17 DIAGNOSIS — J9611 Chronic respiratory failure with hypoxia: Secondary | ICD-10-CM | POA: Diagnosis not present

## 2019-04-17 DIAGNOSIS — J9602 Acute respiratory failure with hypercapnia: Secondary | ICD-10-CM | POA: Diagnosis not present

## 2019-04-17 DIAGNOSIS — Z20828 Contact with and (suspected) exposure to other viral communicable diseases: Secondary | ICD-10-CM | POA: Diagnosis not present

## 2019-04-17 DIAGNOSIS — J440 Chronic obstructive pulmonary disease with acute lower respiratory infection: Secondary | ICD-10-CM | POA: Diagnosis not present

## 2019-04-17 DIAGNOSIS — E119 Type 2 diabetes mellitus without complications: Secondary | ICD-10-CM | POA: Diagnosis not present

## 2019-04-17 DIAGNOSIS — I10 Essential (primary) hypertension: Secondary | ICD-10-CM | POA: Diagnosis not present

## 2019-04-17 DIAGNOSIS — J9621 Acute and chronic respiratory failure with hypoxia: Secondary | ICD-10-CM | POA: Diagnosis not present

## 2019-04-17 DIAGNOSIS — J189 Pneumonia, unspecified organism: Secondary | ICD-10-CM | POA: Diagnosis not present

## 2019-04-17 DIAGNOSIS — J441 Chronic obstructive pulmonary disease with (acute) exacerbation: Secondary | ICD-10-CM | POA: Diagnosis not present

## 2019-04-17 DIAGNOSIS — R0902 Hypoxemia: Secondary | ICD-10-CM | POA: Diagnosis not present

## 2019-04-17 DIAGNOSIS — R269 Unspecified abnormalities of gait and mobility: Secondary | ICD-10-CM | POA: Diagnosis not present

## 2019-04-17 DIAGNOSIS — D860 Sarcoidosis of lung: Secondary | ICD-10-CM | POA: Diagnosis not present

## 2019-04-17 DIAGNOSIS — J449 Chronic obstructive pulmonary disease, unspecified: Secondary | ICD-10-CM | POA: Diagnosis not present

## 2019-04-17 DIAGNOSIS — J9622 Acute and chronic respiratory failure with hypercapnia: Secondary | ICD-10-CM | POA: Diagnosis not present

## 2019-04-17 DIAGNOSIS — R079 Chest pain, unspecified: Secondary | ICD-10-CM | POA: Diagnosis not present

## 2019-04-17 DIAGNOSIS — Z9981 Dependence on supplemental oxygen: Secondary | ICD-10-CM | POA: Diagnosis not present

## 2019-04-17 DIAGNOSIS — R062 Wheezing: Secondary | ICD-10-CM | POA: Diagnosis not present

## 2019-04-17 DIAGNOSIS — J9601 Acute respiratory failure with hypoxia: Secondary | ICD-10-CM | POA: Diagnosis not present

## 2019-05-19 DIAGNOSIS — R269 Unspecified abnormalities of gait and mobility: Secondary | ICD-10-CM | POA: Diagnosis not present

## 2019-05-19 DIAGNOSIS — R062 Wheezing: Secondary | ICD-10-CM | POA: Diagnosis not present

## 2019-05-19 DIAGNOSIS — D860 Sarcoidosis of lung: Secondary | ICD-10-CM | POA: Diagnosis not present

## 2019-05-20 ENCOUNTER — Encounter: Payer: Self-pay | Admitting: Cardiovascular Disease

## 2019-05-20 ENCOUNTER — Other Ambulatory Visit: Payer: Self-pay

## 2019-05-20 ENCOUNTER — Ambulatory Visit (INDEPENDENT_AMBULATORY_CARE_PROVIDER_SITE_OTHER): Payer: Medicare HMO | Admitting: Cardiovascular Disease

## 2019-05-20 VITALS — BP 142/71 | HR 106 | Ht 65.5 in | Wt 249.0 lb

## 2019-05-20 DIAGNOSIS — R600 Localized edema: Secondary | ICD-10-CM

## 2019-05-20 DIAGNOSIS — I2729 Other secondary pulmonary hypertension: Secondary | ICD-10-CM | POA: Diagnosis not present

## 2019-05-20 DIAGNOSIS — J9612 Chronic respiratory failure with hypercapnia: Secondary | ICD-10-CM | POA: Diagnosis not present

## 2019-05-20 DIAGNOSIS — I10 Essential (primary) hypertension: Secondary | ICD-10-CM

## 2019-05-20 DIAGNOSIS — K449 Diaphragmatic hernia without obstruction or gangrene: Secondary | ICD-10-CM | POA: Diagnosis not present

## 2019-05-20 DIAGNOSIS — I313 Pericardial effusion (noninflammatory): Secondary | ICD-10-CM | POA: Diagnosis not present

## 2019-05-20 DIAGNOSIS — I3139 Other pericardial effusion (noninflammatory): Secondary | ICD-10-CM

## 2019-05-20 DIAGNOSIS — J9611 Chronic respiratory failure with hypoxia: Secondary | ICD-10-CM | POA: Diagnosis not present

## 2019-05-20 NOTE — Patient Instructions (Signed)

## 2019-05-20 NOTE — Progress Notes (Signed)
SUBJECTIVE: The patient presents for routine follow-up.  She has a history of sarcoidosis and bilateral leg edema as well as a pericardial effusion. She has hypoxic and hypercarbic respiratory failure and is on oxygen at 2 L continuously and nocturnal BiPAP. She has severe restrictive lung disease due to left hemidiaphragmatic paralysis and a large hiatal hernia.  Most recent echocardiogram on 05/12/18 showed vigorous LV systolic function, EF 16-10%, and a small pericardial effusion.  Chronic exertional dyspnea is stable.  She denies chest pain and palpitations.    Review of Systems: As per "subjective", otherwise negative.  No Known Allergies  Current Outpatient Medications  Medication Sig Dispense Refill  . furosemide (LASIX) 40 MG tablet Take 20 mg by mouth every morning.   2  . metFORMIN (GLUCOPHAGE) 500 MG tablet Take 2 (two) times daily with a meal by mouth.    Marland Kitchen NIFEdipine (PROCARDIA XL/ADALAT-CC) 90 MG 24 hr tablet Take 90 mg by mouth every morning.     . valsartan (DIOVAN) 320 MG tablet Take 320 mg by mouth every morning.     No current facility-administered medications for this visit.     Past Medical History:  Diagnosis Date  . GERD (gastroesophageal reflux disease)   . Hiatal hernia   . Hyperlipidemia   . Hypertension   . Sarcoidosis     Past Surgical History:  Procedure Laterality Date  . CYST REMOVAL TRUNK     back  . PARTIAL HYSTERECTOMY      Social History   Socioeconomic History  . Marital status: Widowed    Spouse name: Not on file  . Number of children: Not on file  . Years of education: Not on file  . Highest education level: Not on file  Occupational History  . Not on file  Social Needs  . Financial resource strain: Not on file  . Food insecurity    Worry: Not on file    Inability: Not on file  . Transportation needs    Medical: Not on file    Non-medical: Not on file  Tobacco Use  . Smoking status: Never Smoker  . Smokeless  tobacco: Never Used  Substance and Sexual Activity  . Alcohol use: Not on file  . Drug use: Not on file  . Sexual activity: Not on file  Lifestyle  . Physical activity    Days per week: Not on file    Minutes per session: Not on file  . Stress: Not on file  Relationships  . Social Herbalist on phone: Not on file    Gets together: Not on file    Attends religious service: Not on file    Active member of club or organization: Not on file    Attends meetings of clubs or organizations: Not on file    Relationship status: Not on file  . Intimate partner violence    Fear of current or ex partner: Not on file    Emotionally abused: Not on file    Physically abused: Not on file    Forced sexual activity: Not on file  Other Topics Concern  . Not on file  Social History Narrative  . Not on file     Vitals:   05/20/19 1459  BP: (!) 142/71  Pulse: (!) 106  SpO2: 92%  Weight: 249 lb (112.9 kg)  Height: 5' 5.5" (1.664 m)    Wt Readings from Last 3 Encounters:  05/20/19 249 lb (  112.9 kg)  07/16/18 229 lb (103.9 kg)  05/07/18 227 lb (103 kg)     PHYSICAL EXAM General: NAD, using oxygen by nasal cannula. HEENT: Normal. Neck: No JVD, no thyromegaly. Lungs: Clear to auscultation bilaterally with normal respiratory effort. CV: Regular rate and rhythm, normal S1/S2, no S3/S4, no murmur. No significant pretibial or periankle edema.  No carotid bruit.   Abdomen: Soft, nontender, no distention.  Neurologic: Alert and oriented.  Psych: Normal affect. Skin: Normal. Musculoskeletal: No gross deformities.      Labs: No results found for: K, BUN, CREATININE, ALT, TSH, HGB   Lipids: No results found for: LDLCALC, LDLDIRECT, CHOL, TRIG, HDL     ASSESSMENT AND PLAN: 1. Pericardial effusion: Small by echo in 04/2018.Currently on Lasix 20 mg daily.  2. Hypertension: She is on Procardia 90 mg daily.  3. Bilateral lower extremity edema: Currently on Lasix 20 mg  daily. I previously prescribed compression stockings.  4. Large hiatal hernia: She does not want surgery at her age.  5. Pulmonary hypertension: Moderate in severity and likely due to pulmonary sarcoidosis with chronic hypoxic, hypercarbic respiratory failure. She is on nocturnal BiPAP and O2 and followed by pulmonary.  Symptoms are stable.    Disposition: Follow up 6 months   Prentice Docker, M.D., F.A.C.C.

## 2019-06-19 DIAGNOSIS — R062 Wheezing: Secondary | ICD-10-CM | POA: Diagnosis not present

## 2019-06-19 DIAGNOSIS — D860 Sarcoidosis of lung: Secondary | ICD-10-CM | POA: Diagnosis not present

## 2019-06-19 DIAGNOSIS — R269 Unspecified abnormalities of gait and mobility: Secondary | ICD-10-CM | POA: Diagnosis not present

## 2019-06-27 DIAGNOSIS — I10 Essential (primary) hypertension: Secondary | ICD-10-CM | POA: Diagnosis not present

## 2019-06-27 DIAGNOSIS — E039 Hypothyroidism, unspecified: Secondary | ICD-10-CM | POA: Diagnosis not present

## 2019-07-19 DIAGNOSIS — R269 Unspecified abnormalities of gait and mobility: Secondary | ICD-10-CM | POA: Diagnosis not present

## 2019-07-19 DIAGNOSIS — R062 Wheezing: Secondary | ICD-10-CM | POA: Diagnosis not present

## 2019-07-19 DIAGNOSIS — D860 Sarcoidosis of lung: Secondary | ICD-10-CM | POA: Diagnosis not present

## 2019-07-28 DIAGNOSIS — E1165 Type 2 diabetes mellitus with hyperglycemia: Secondary | ICD-10-CM | POA: Diagnosis not present

## 2019-07-28 DIAGNOSIS — I10 Essential (primary) hypertension: Secondary | ICD-10-CM | POA: Diagnosis not present

## 2019-08-10 ENCOUNTER — Other Ambulatory Visit: Payer: Medicare HMO

## 2019-08-19 DIAGNOSIS — R269 Unspecified abnormalities of gait and mobility: Secondary | ICD-10-CM | POA: Diagnosis not present

## 2019-08-19 DIAGNOSIS — D860 Sarcoidosis of lung: Secondary | ICD-10-CM | POA: Diagnosis not present

## 2019-08-19 DIAGNOSIS — R062 Wheezing: Secondary | ICD-10-CM | POA: Diagnosis not present

## 2019-08-26 DIAGNOSIS — I10 Essential (primary) hypertension: Secondary | ICD-10-CM | POA: Diagnosis not present

## 2019-08-26 DIAGNOSIS — E1165 Type 2 diabetes mellitus with hyperglycemia: Secondary | ICD-10-CM | POA: Diagnosis not present

## 2019-09-19 DIAGNOSIS — R269 Unspecified abnormalities of gait and mobility: Secondary | ICD-10-CM | POA: Diagnosis not present

## 2019-09-19 DIAGNOSIS — R062 Wheezing: Secondary | ICD-10-CM | POA: Diagnosis not present

## 2019-09-19 DIAGNOSIS — D860 Sarcoidosis of lung: Secondary | ICD-10-CM | POA: Diagnosis not present

## 2019-09-23 DIAGNOSIS — I10 Essential (primary) hypertension: Secondary | ICD-10-CM | POA: Diagnosis not present

## 2019-09-23 DIAGNOSIS — E7849 Other hyperlipidemia: Secondary | ICD-10-CM | POA: Diagnosis not present

## 2019-09-26 DIAGNOSIS — J9610 Chronic respiratory failure, unspecified whether with hypoxia or hypercapnia: Secondary | ICD-10-CM | POA: Diagnosis not present

## 2019-10-17 DIAGNOSIS — R062 Wheezing: Secondary | ICD-10-CM | POA: Diagnosis not present

## 2019-10-17 DIAGNOSIS — D860 Sarcoidosis of lung: Secondary | ICD-10-CM | POA: Diagnosis not present

## 2019-10-17 DIAGNOSIS — R269 Unspecified abnormalities of gait and mobility: Secondary | ICD-10-CM | POA: Diagnosis not present

## 2019-10-26 DIAGNOSIS — I10 Essential (primary) hypertension: Secondary | ICD-10-CM | POA: Diagnosis not present

## 2019-10-26 DIAGNOSIS — E7849 Other hyperlipidemia: Secondary | ICD-10-CM | POA: Diagnosis not present

## 2019-11-11 DIAGNOSIS — E041 Nontoxic single thyroid nodule: Secondary | ICD-10-CM | POA: Diagnosis not present

## 2019-11-11 DIAGNOSIS — G4733 Obstructive sleep apnea (adult) (pediatric): Secondary | ICD-10-CM | POA: Diagnosis not present

## 2019-11-11 DIAGNOSIS — G2581 Restless legs syndrome: Secondary | ICD-10-CM | POA: Diagnosis not present

## 2019-11-11 DIAGNOSIS — I10 Essential (primary) hypertension: Secondary | ICD-10-CM | POA: Diagnosis not present

## 2019-11-11 DIAGNOSIS — Z6839 Body mass index (BMI) 39.0-39.9, adult: Secondary | ICD-10-CM | POA: Diagnosis not present

## 2019-11-11 DIAGNOSIS — D869 Sarcoidosis, unspecified: Secondary | ICD-10-CM | POA: Diagnosis not present

## 2019-11-11 DIAGNOSIS — K219 Gastro-esophageal reflux disease without esophagitis: Secondary | ICD-10-CM | POA: Diagnosis not present

## 2019-11-11 DIAGNOSIS — E0580 Other thyrotoxicosis without thyrotoxic crisis or storm: Secondary | ICD-10-CM | POA: Diagnosis not present

## 2019-11-11 DIAGNOSIS — Z0000 Encounter for general adult medical examination without abnormal findings: Secondary | ICD-10-CM | POA: Diagnosis not present

## 2019-11-11 DIAGNOSIS — E782 Mixed hyperlipidemia: Secondary | ICD-10-CM | POA: Diagnosis not present

## 2019-11-17 DIAGNOSIS — D860 Sarcoidosis of lung: Secondary | ICD-10-CM | POA: Diagnosis not present

## 2019-11-17 DIAGNOSIS — R269 Unspecified abnormalities of gait and mobility: Secondary | ICD-10-CM | POA: Diagnosis not present

## 2019-11-17 DIAGNOSIS — R062 Wheezing: Secondary | ICD-10-CM | POA: Diagnosis not present

## 2019-11-21 ENCOUNTER — Ambulatory Visit (INDEPENDENT_AMBULATORY_CARE_PROVIDER_SITE_OTHER): Payer: Medicare HMO | Admitting: Cardiovascular Disease

## 2019-11-21 ENCOUNTER — Encounter: Payer: Self-pay | Admitting: Cardiovascular Disease

## 2019-11-21 ENCOUNTER — Other Ambulatory Visit: Payer: Self-pay

## 2019-11-21 VITALS — BP 140/72 | HR 94 | Ht 65.5 in | Wt 226.0 lb

## 2019-11-21 DIAGNOSIS — R0602 Shortness of breath: Secondary | ICD-10-CM

## 2019-11-21 DIAGNOSIS — I3139 Other pericardial effusion (noninflammatory): Secondary | ICD-10-CM

## 2019-11-21 DIAGNOSIS — J9611 Chronic respiratory failure with hypoxia: Secondary | ICD-10-CM

## 2019-11-21 DIAGNOSIS — I10 Essential (primary) hypertension: Secondary | ICD-10-CM

## 2019-11-21 DIAGNOSIS — R600 Localized edema: Secondary | ICD-10-CM | POA: Diagnosis not present

## 2019-11-21 DIAGNOSIS — J9612 Chronic respiratory failure with hypercapnia: Secondary | ICD-10-CM | POA: Diagnosis not present

## 2019-11-21 DIAGNOSIS — K449 Diaphragmatic hernia without obstruction or gangrene: Secondary | ICD-10-CM

## 2019-11-21 DIAGNOSIS — I2729 Other secondary pulmonary hypertension: Secondary | ICD-10-CM

## 2019-11-21 DIAGNOSIS — I313 Pericardial effusion (noninflammatory): Secondary | ICD-10-CM | POA: Diagnosis not present

## 2019-11-21 NOTE — Patient Instructions (Addendum)
Medication Instructions:  Continue all current medications.  Labwork: none  Testing/Procedures:  Your physician has requested that you have an echocardiogram. Echocardiography is a painless test that uses sound waves to create images of your heart. It provides your doctor with information about the size and shape of your heart and how well your heart's chambers and valves are working. This procedure takes approximately one hour. There are no restrictions for this procedure - limited echo.   Office will contact with results via phone or letter.    Follow-Up: 6 months   Any Other Special Instructions Will Be Listed Below (If Applicable). Follow up with pulmonology (Dr. Vassie Loll).    If you need a refill on your cardiac medications before your next appointment, please call your pharmacy.

## 2019-11-21 NOTE — Progress Notes (Signed)
SUBJECTIVE: The patient presents for routine follow-up.  She has a history of sarcoidosis and bilateral leg edema as well as a pericardial effusion.She has hypoxic and hypercarbic respiratory failureand ison oxygen at 2 L continuously andnocturnal BiPAP. She has severe restrictive lung disease due to left hemidiaphragmatic paralysis and a large hiatal hernia.  Most recent echocardiogram on 05/12/18 showed vigorous LV systolic function, EF 65-70%, and a small pericardial effusion.  She has been having worsening shortness of breath from baseline exertional dyspnea.  She has not seen her pulmonologist since 2019.  She has mild bilateral leg swelling. She denies chest pain and palpitations.  I personally reviewed the ECG performed today which demonstrates sinus rhythm with PVCs and T wave inversions inferiorly and lead I.      Review of Systems: As per "subjective", otherwise negative.  No Known Allergies  Current Outpatient Medications  Medication Sig Dispense Refill  . furosemide (LASIX) 40 MG tablet Take 20 mg by mouth every morning.   2  . valsartan (DIOVAN) 320 MG tablet Take 320 mg by mouth every morning.     No current facility-administered medications for this visit.    Past Medical History:  Diagnosis Date  . GERD (gastroesophageal reflux disease)   . Hiatal hernia   . Hyperlipidemia   . Hypertension   . Sarcoidosis     Past Surgical History:  Procedure Laterality Date  . CYST REMOVAL TRUNK     back  . PARTIAL HYSTERECTOMY      Social History   Socioeconomic History  . Marital status: Widowed    Spouse name: Not on file  . Number of children: Not on file  . Years of education: Not on file  . Highest education level: Not on file  Occupational History  . Not on file  Tobacco Use  . Smoking status: Never Smoker  . Smokeless tobacco: Never Used  Substance and Sexual Activity  . Alcohol use: Not on file  . Drug use: Not on file  . Sexual  activity: Not on file  Other Topics Concern  . Not on file  Social History Narrative  . Not on file   Social Determinants of Health   Financial Resource Strain:   . Difficulty of Paying Living Expenses:   Food Insecurity:   . Worried About Programme researcher, broadcasting/film/video in the Last Year:   . Barista in the Last Year:   Transportation Needs:   . Freight forwarder (Medical):   Marland Kitchen Lack of Transportation (Non-Medical):   Physical Activity:   . Days of Exercise per Week:   . Minutes of Exercise per Session:   Stress:   . Feeling of Stress :   Social Connections:   . Frequency of Communication with Friends and Family:   . Frequency of Social Gatherings with Friends and Family:   . Attends Religious Services:   . Active Member of Clubs or Organizations:   . Attends Banker Meetings:   Marland Kitchen Marital Status:   Intimate Partner Violence:   . Fear of Current or Ex-Partner:   . Emotionally Abused:   Marland Kitchen Physically Abused:   . Sexually Abused:     Garnet Sierras, LPN was present throughout the entirety of the encounter.  Vitals:   11/21/19 1336  BP: 140/72  Pulse: 94  SpO2: 95%  Weight: 226 lb (102.5 kg)  Height: 5' 5.5" (1.664 m)    Wt Readings from Last 3  Encounters:  11/21/19 226 lb (102.5 kg)  05/20/19 249 lb (112.9 kg)  07/16/18 229 lb (103.9 kg)     PHYSICAL EXAM General: NAD HEENT: Normal. Neck: No JVD, no thyromegaly. Lungs: Poor air movement with faint bilateral expiratory wheezes. CV: Regular rate and rhythm, normal S1/S2, no S3/S4, no murmur.  Trace bilateral periankle edema.    Abdomen: Soft, nontender, no distention.  Neurologic: Alert and oriented.  Psych: Normal affect. Skin: Normal. Musculoskeletal: No gross deformities.      Labs: No results found for: K, BUN, CREATININE, ALT, TSH, HGB   Lipids: No results found for: LDLCALC, LDLDIRECT, CHOL, TRIG, HDL     ASSESSMENT AND PLAN:  1. Pericardial effusion:Small by echo in  04/2018.Currently on Lasix20 mg daily.  Given her shortness of breath, I will obtain a limited follow-up echocardiogram to assess for interval changes.  2. Hypertension:She isnow on valsartan 320 mg daily.  She was previously on Procardia 90 mg daily.  No changes to therapy.  3. Bilateral lower extremity edema: Currently on Lasix20 mg daily. Ipreviously prescribedcompression stockings.  4. Large hiatal hernia:She does not want surgery at her age.  5. Shortness of breath/pulmonary hypertension: Moderate in severity and likely due to pulmonary sarcoidosis with chronichypoxic,hypercarbic respiratory failure. She is onnocturnalBiPAPand O2.  She has not seen her pulmonologist since 2019.  Given increasing shortness of breath, I will make an appointment for her.   Disposition: Follow up 6 months virtual visit   Shelley Gonzalez, M.D., F.A.C.C.

## 2019-11-22 DIAGNOSIS — M7502 Adhesive capsulitis of left shoulder: Secondary | ICD-10-CM | POA: Diagnosis not present

## 2019-11-22 DIAGNOSIS — E1122 Type 2 diabetes mellitus with diabetic chronic kidney disease: Secondary | ICD-10-CM | POA: Diagnosis not present

## 2019-11-22 DIAGNOSIS — J449 Chronic obstructive pulmonary disease, unspecified: Secondary | ICD-10-CM | POA: Diagnosis not present

## 2019-11-22 DIAGNOSIS — N183 Chronic kidney disease, stage 3 unspecified: Secondary | ICD-10-CM | POA: Diagnosis not present

## 2019-11-22 DIAGNOSIS — M1990 Unspecified osteoarthritis, unspecified site: Secondary | ICD-10-CM | POA: Diagnosis not present

## 2019-11-22 DIAGNOSIS — D869 Sarcoidosis, unspecified: Secondary | ICD-10-CM | POA: Diagnosis not present

## 2019-11-22 DIAGNOSIS — G2581 Restless legs syndrome: Secondary | ICD-10-CM | POA: Diagnosis not present

## 2019-11-22 DIAGNOSIS — H9190 Unspecified hearing loss, unspecified ear: Secondary | ICD-10-CM | POA: Diagnosis not present

## 2019-11-22 DIAGNOSIS — I129 Hypertensive chronic kidney disease with stage 1 through stage 4 chronic kidney disease, or unspecified chronic kidney disease: Secondary | ICD-10-CM | POA: Diagnosis not present

## 2019-11-25 DIAGNOSIS — K219 Gastro-esophageal reflux disease without esophagitis: Secondary | ICD-10-CM | POA: Diagnosis not present

## 2019-11-25 DIAGNOSIS — J439 Emphysema, unspecified: Secondary | ICD-10-CM | POA: Diagnosis not present

## 2019-11-25 DIAGNOSIS — I10 Essential (primary) hypertension: Secondary | ICD-10-CM | POA: Diagnosis not present

## 2019-11-28 DIAGNOSIS — M7502 Adhesive capsulitis of left shoulder: Secondary | ICD-10-CM | POA: Diagnosis not present

## 2019-11-28 DIAGNOSIS — K449 Diaphragmatic hernia without obstruction or gangrene: Secondary | ICD-10-CM | POA: Diagnosis not present

## 2019-11-28 DIAGNOSIS — J449 Chronic obstructive pulmonary disease, unspecified: Secondary | ICD-10-CM | POA: Diagnosis not present

## 2019-11-28 DIAGNOSIS — K219 Gastro-esophageal reflux disease without esophagitis: Secondary | ICD-10-CM | POA: Diagnosis not present

## 2019-11-28 DIAGNOSIS — M1990 Unspecified osteoarthritis, unspecified site: Secondary | ICD-10-CM | POA: Diagnosis not present

## 2019-11-28 DIAGNOSIS — E1122 Type 2 diabetes mellitus with diabetic chronic kidney disease: Secondary | ICD-10-CM | POA: Diagnosis not present

## 2019-11-28 DIAGNOSIS — G2581 Restless legs syndrome: Secondary | ICD-10-CM | POA: Diagnosis not present

## 2019-11-28 DIAGNOSIS — H9190 Unspecified hearing loss, unspecified ear: Secondary | ICD-10-CM | POA: Diagnosis not present

## 2019-11-28 DIAGNOSIS — N183 Chronic kidney disease, stage 3 unspecified: Secondary | ICD-10-CM | POA: Diagnosis not present

## 2019-11-28 DIAGNOSIS — G4733 Obstructive sleep apnea (adult) (pediatric): Secondary | ICD-10-CM | POA: Diagnosis not present

## 2019-11-28 DIAGNOSIS — D869 Sarcoidosis, unspecified: Secondary | ICD-10-CM | POA: Diagnosis not present

## 2019-11-28 DIAGNOSIS — E782 Mixed hyperlipidemia: Secondary | ICD-10-CM | POA: Diagnosis not present

## 2019-11-28 DIAGNOSIS — Z9981 Dependence on supplemental oxygen: Secondary | ICD-10-CM | POA: Diagnosis not present

## 2019-11-28 DIAGNOSIS — I129 Hypertensive chronic kidney disease with stage 1 through stage 4 chronic kidney disease, or unspecified chronic kidney disease: Secondary | ICD-10-CM | POA: Diagnosis not present

## 2019-11-28 DIAGNOSIS — E041 Nontoxic single thyroid nodule: Secondary | ICD-10-CM | POA: Diagnosis not present

## 2019-11-28 DIAGNOSIS — E0580 Other thyrotoxicosis without thyrotoxic crisis or storm: Secondary | ICD-10-CM | POA: Diagnosis not present

## 2019-11-29 ENCOUNTER — Other Ambulatory Visit: Payer: Self-pay

## 2019-11-29 ENCOUNTER — Ambulatory Visit: Payer: Medicare HMO | Admitting: Adult Health

## 2019-11-29 ENCOUNTER — Encounter: Payer: Self-pay | Admitting: Adult Health

## 2019-11-29 ENCOUNTER — Ambulatory Visit (INDEPENDENT_AMBULATORY_CARE_PROVIDER_SITE_OTHER): Payer: Medicare Other

## 2019-11-29 VITALS — BP 132/80 | HR 101 | Temp 97.3°F | Ht 65.5 in | Wt 226.0 lb

## 2019-11-29 DIAGNOSIS — J9611 Chronic respiratory failure with hypoxia: Secondary | ICD-10-CM

## 2019-11-29 DIAGNOSIS — J9612 Chronic respiratory failure with hypercapnia: Secondary | ICD-10-CM | POA: Diagnosis not present

## 2019-11-29 DIAGNOSIS — I2729 Other secondary pulmonary hypertension: Secondary | ICD-10-CM

## 2019-11-29 DIAGNOSIS — J449 Chronic obstructive pulmonary disease, unspecified: Secondary | ICD-10-CM | POA: Diagnosis not present

## 2019-11-29 NOTE — Patient Instructions (Addendum)
Chest x-ray today Continue on oxygen 3l/m  Continue on BiPAP at bedtime Wear each night , all night long while sleeping.  Activity as tolerated Follow-up with Dr. Vassie Loll in 4 weeks and as needed Please contact office for sooner follow up if symptoms do not improve or worsen or seek emergency care

## 2019-11-29 NOTE — Assessment & Plan Note (Signed)
Possibly due to restrictive lung disease and possible underlying sarcoidosis.  Patient is following with cardiology and has a 2D echo pending.

## 2019-11-29 NOTE — Assessment & Plan Note (Addendum)
Suspect is multifactorial with severe restrictive lung disease as she has a left-sided hemodiaphragm paralysis, massive hiatal hernia with left lung compression , moderate pulmonary hypertension.  Unfortunately been a unable to obtain pulmonary function testing. O2 saturations are maintained on 3 L of oxygen. Possible underlying diagnosis of sarcoidosis.  Will check chest x-ray today.  Pending these results decide on a CT chest. Continue on BiPAP at bedtime.  Plan  Patient Instructions  Chest x-ray today Continue on oxygen 3l/m  Continue on BiPAP at bedtime Wear each night , all night long while sleeping.  Activity as tolerated Follow-up with Dr. Vassie Loll in 4 weeks and as needed Please contact office for sooner follow up if symptoms do not improve or worsen or seek emergency care

## 2019-11-29 NOTE — Progress Notes (Addendum)
@Patient  ID: Shelley Gonzalez, female    DOB: Nov 13, 1940, 79 y.o.   MRN: [id number]  Chief Complaint  Patient presents with  . Follow-up    COPD     Referring provider: Rory Percy, MD  HPI: 79 year old female never smoker followed for hypoxic and hypercarbic respiratory failure on nocturnal BiPAP and oxygen. She has severe restrictive lung disease due to left hemidiaphragm paralysis and large hiatal hernia dating back to 2005. Left lung is almost fully atelectatic. ? Sarcoidosis dx at outside facility .  Medical history significant for pericardial effusion October 2018 followed by a cardiology  TEST/EVENTS :  ABG was 7.32/80 7/50 on 2 L oxygen Sniff test 06/26/17 consistent with left diaphragmatic paralysis. PFTs >> could not perform  CT chest 04/2017 was negative for pulmonary embolism, showed massive hiatal hernia on the left, small pleural effusion. Echo10/2018showed normal LV function with moderate pulmonary hypertension at 52 and moderate circumferential pericardial effusion without tamponade physiology  Echo 10/2017 >> mod effusion, RVSP 46   NPSG 06/2017- for obstructive sleep apnea,positive for nocturnal hypoxemia  2D echo October 2019 showed EF 65 to 70%, small pericardial effusion without tamponade   11/29/2019 Follow up : O2 RF , Restrictive Lung disease , ?Sarcoid  Patient returns for a follow-up.  She was last seen in December 2019.  She was recommended for chest x-ray and follow-up in 3 months at that visit.  Unfortunately did not continue with recommendations.  Patient says overall she continues to have shortness of breath with minimal activity.  She is sedentary.  Gets winded with light walking and chores.  She has no significant cough.  She was recently seen by cardiology for shortness of breath.  Or a 2D echo. Previously PFTs were recommended 2018 but she was unable to complete them. Patient carries a diagnosis of sarcoidosis but is on clear how this was  diagnosed as no records have been able to be obtained.  Patient does not remember any type of biopsy. Previous chest x-ray and CT scans have shown massive hiatal hernia with compression of left lung.  And she has associated left hemodiaphragm paralysis.  She has been oxygen dependent she is currently on 3 L of oxygen.  Denies chest pain orthopnea PND or in increased leg swelling. She has received both of her COVID-19 vaccines.  She remains on BiPAP at bedtime.  Says she is doing okay on it.  Has missed the last couple nights.  BiPAP download shows 77% compliance with daily average use is 8.5 hours.  AHI 1.6.  Patient is on IPAP 12, EPAP 6 cm H2O.  No Known Allergies  Immunization History  Administered Date(s) Administered  . Influenza Split 05/22/2017  . Influenza, High Dose Seasonal PF 04/27/2017, 04/28/2018  . Moderna SARS-COVID-2 Vaccination 09/02/2019, 09/30/2019  . Pneumococcal Polysaccharide-23 07/29/2015    Past Medical History:  Diagnosis Date  . GERD (gastroesophageal reflux disease)   . Hiatal hernia   . Hyperlipidemia   . Hypertension   . Sarcoidosis     Tobacco History: Social History   Tobacco Use  Smoking Status Never Smoker  Smokeless Tobacco Never Used   Counseling given: Not Answered   Outpatient Medications Prior to Visit  Medication Sig Dispense Refill  . furosemide (LASIX) 40 MG tablet Take 20 mg by mouth every morning.   2  . valsartan (DIOVAN) 320 MG tablet Take 320 mg by mouth every morning.     No facility-administered medications prior to visit.  Review of Systems:   Constitutional:   No  weight loss, night sweats,  Fevers, chills, + fatigue, or  lassitude.  HEENT:   No headaches,  Difficulty swallowing,  Tooth/dental problems, or  Sore throat,                No sneezing, itching, ear ache, nasal congestion, post nasal drip,   CV:  No chest pain,  Orthopnea, PND, swelling in lower extremities, anasarca, dizziness, palpitations, syncope.    GI  No heartburn, indigestion, abdominal pain, nausea, vomiting, diarrhea, change in bowel habits, loss of appetite, bloody stools.   Resp:    No chest wall deformity  Skin: no rash or lesions.  GU: no dysuria, change in color of urine, no urgency or frequency.  No flank pain, no hematuria   MS:  No joint pain or swelling.  No decreased range of motion.  No back pain.    Physical Exam  BP 132/80 (BP Location: Right Arm, Patient Position: Sitting, Cuff Size: Large)   Pulse (!) 101   Temp (!) 97.3 F (36.3 C) (Temporal)   Ht 5' 5.5" (1.664 m)   Wt 226 lb (102.5 kg)   SpO2 92% Comment: o2 3l/min  BMI 37.04 kg/m   GEN: A/Ox3; pleasant , NAD, elderly female on oxygen   HEENT:  [place]/AT,  EACs-clear, TMs-wnl, NOSE-clear, THROAT-clear, no lesions, no postnasal drip or exudate noted.   NECK:  Supple w/ fair ROM; no JVD; normal carotid impulses w/o bruits; no thyromegaly or nodules palpated; no lymphadenopathy.    RESP  Clear  P & A; w/o, wheezes/ rales/ or rhonchi. no accessory muscle use, no dullness to percussion  CARD:  RRR, no m/r/g, tr-1+ peripheral edema, pulses intact, no cyanosis or clubbing.  GI:   Soft & nt; nml bowel sounds; no organomegaly or masses detected.   Musco: Warm bil, no deformities or joint swelling noted.   Neuro: alert, no focal deficits noted.    Skin: Warm, no lesions or rashes    Lab Results:  CBC No results found for: WBC, RBC, HGB, HCT, PLT, MCV, MCH, MCHC, RDW, LYMPHSABS, MONOABS, EOSABS, BASOSABS  BMET No results found for: NA, K, CL, CO2, GLUCOSE, BUN, CREATININE, CALCIUM, GFRNONAA, GFRAA  BNP No results found for: BNP  ProBNP No results found for: PROBNP  Imaging: No results found.    No flowsheet data found.  No results found for: NITRICOXIDE      Assessment & Plan:   Chronic respiratory failure with hypoxia and hypercapnia (HCC) Suspect is multifactorial with severe restrictive lung disease as she has a left-sided  hemodiaphragm paralysis, massive hiatal hernia with left lung compression , moderate pulmonary hypertension.  Unfortunately been a unable to obtain pulmonary function testing. O2 saturations are maintained on 3 L of oxygen. Possible underlying diagnosis of sarcoidosis.  Will check chest x-ray today.  Pending these results decide on a CT chest.  Plan  Patient Instructions  Chest x-ray today Continue on oxygen 3l/m  Continue on BiPAP at bedtime Wear each night , all night long while sleeping.  Activity as tolerated Follow-up with Dr. Vassie Loll in 4 weeks and as needed Please contact office for sooner follow up if symptoms do not improve or worsen or seek emergency care       Other secondary pulmonary hypertension (HCC) Possibly due to restrictive lung disease and possible underlying sarcoidosis.  Patient is following with cardiology and has a 2D echo pending.     Cereniti Curb  Jaylaa Gallion, NP 11/29/2019

## 2019-11-30 DIAGNOSIS — D869 Sarcoidosis, unspecified: Secondary | ICD-10-CM | POA: Diagnosis not present

## 2019-11-30 DIAGNOSIS — M7502 Adhesive capsulitis of left shoulder: Secondary | ICD-10-CM | POA: Diagnosis not present

## 2019-11-30 DIAGNOSIS — I129 Hypertensive chronic kidney disease with stage 1 through stage 4 chronic kidney disease, or unspecified chronic kidney disease: Secondary | ICD-10-CM | POA: Diagnosis not present

## 2019-11-30 DIAGNOSIS — E782 Mixed hyperlipidemia: Secondary | ICD-10-CM | POA: Diagnosis not present

## 2019-11-30 DIAGNOSIS — H9190 Unspecified hearing loss, unspecified ear: Secondary | ICD-10-CM | POA: Diagnosis not present

## 2019-11-30 DIAGNOSIS — K219 Gastro-esophageal reflux disease without esophagitis: Secondary | ICD-10-CM | POA: Diagnosis not present

## 2019-11-30 DIAGNOSIS — G4733 Obstructive sleep apnea (adult) (pediatric): Secondary | ICD-10-CM | POA: Diagnosis not present

## 2019-11-30 DIAGNOSIS — J449 Chronic obstructive pulmonary disease, unspecified: Secondary | ICD-10-CM | POA: Diagnosis not present

## 2019-11-30 DIAGNOSIS — N183 Chronic kidney disease, stage 3 unspecified: Secondary | ICD-10-CM | POA: Diagnosis not present

## 2019-11-30 DIAGNOSIS — E0580 Other thyrotoxicosis without thyrotoxic crisis or storm: Secondary | ICD-10-CM | POA: Diagnosis not present

## 2019-11-30 DIAGNOSIS — Z9981 Dependence on supplemental oxygen: Secondary | ICD-10-CM | POA: Diagnosis not present

## 2019-11-30 DIAGNOSIS — M1990 Unspecified osteoarthritis, unspecified site: Secondary | ICD-10-CM | POA: Diagnosis not present

## 2019-11-30 DIAGNOSIS — G2581 Restless legs syndrome: Secondary | ICD-10-CM | POA: Diagnosis not present

## 2019-11-30 DIAGNOSIS — K449 Diaphragmatic hernia without obstruction or gangrene: Secondary | ICD-10-CM | POA: Diagnosis not present

## 2019-11-30 DIAGNOSIS — E1122 Type 2 diabetes mellitus with diabetic chronic kidney disease: Secondary | ICD-10-CM | POA: Diagnosis not present

## 2019-11-30 DIAGNOSIS — E041 Nontoxic single thyroid nodule: Secondary | ICD-10-CM | POA: Diagnosis not present

## 2019-12-05 DIAGNOSIS — G2581 Restless legs syndrome: Secondary | ICD-10-CM | POA: Diagnosis not present

## 2019-12-05 DIAGNOSIS — E1122 Type 2 diabetes mellitus with diabetic chronic kidney disease: Secondary | ICD-10-CM | POA: Diagnosis not present

## 2019-12-05 DIAGNOSIS — J449 Chronic obstructive pulmonary disease, unspecified: Secondary | ICD-10-CM | POA: Diagnosis not present

## 2019-12-05 DIAGNOSIS — H9190 Unspecified hearing loss, unspecified ear: Secondary | ICD-10-CM | POA: Diagnosis not present

## 2019-12-05 DIAGNOSIS — E782 Mixed hyperlipidemia: Secondary | ICD-10-CM | POA: Diagnosis not present

## 2019-12-05 DIAGNOSIS — M7502 Adhesive capsulitis of left shoulder: Secondary | ICD-10-CM | POA: Diagnosis not present

## 2019-12-05 DIAGNOSIS — M1990 Unspecified osteoarthritis, unspecified site: Secondary | ICD-10-CM | POA: Diagnosis not present

## 2019-12-05 DIAGNOSIS — E0580 Other thyrotoxicosis without thyrotoxic crisis or storm: Secondary | ICD-10-CM | POA: Diagnosis not present

## 2019-12-05 DIAGNOSIS — N183 Chronic kidney disease, stage 3 unspecified: Secondary | ICD-10-CM | POA: Diagnosis not present

## 2019-12-05 DIAGNOSIS — D869 Sarcoidosis, unspecified: Secondary | ICD-10-CM | POA: Diagnosis not present

## 2019-12-05 DIAGNOSIS — K219 Gastro-esophageal reflux disease without esophagitis: Secondary | ICD-10-CM | POA: Diagnosis not present

## 2019-12-05 DIAGNOSIS — G4733 Obstructive sleep apnea (adult) (pediatric): Secondary | ICD-10-CM | POA: Diagnosis not present

## 2019-12-05 DIAGNOSIS — Z9981 Dependence on supplemental oxygen: Secondary | ICD-10-CM | POA: Diagnosis not present

## 2019-12-05 DIAGNOSIS — E041 Nontoxic single thyroid nodule: Secondary | ICD-10-CM | POA: Diagnosis not present

## 2019-12-05 DIAGNOSIS — I129 Hypertensive chronic kidney disease with stage 1 through stage 4 chronic kidney disease, or unspecified chronic kidney disease: Secondary | ICD-10-CM | POA: Diagnosis not present

## 2019-12-05 DIAGNOSIS — K449 Diaphragmatic hernia without obstruction or gangrene: Secondary | ICD-10-CM | POA: Diagnosis not present

## 2019-12-06 ENCOUNTER — Telehealth: Payer: Self-pay | Admitting: Pulmonary Disease

## 2019-12-06 NOTE — Telephone Encounter (Signed)
Advised pt of results. Pt understood and nothing further is needed.      Parrett, Virgel Bouquet, NP  12/02/2019 1:36 PM EDT    No sign of PNA . Large Hernia -chronic  Cont w/ ov recs Please contact office for sooner follow up if symptoms do not improve or worsen or seek emergency care

## 2019-12-08 DIAGNOSIS — G2581 Restless legs syndrome: Secondary | ICD-10-CM | POA: Diagnosis not present

## 2019-12-08 DIAGNOSIS — H9190 Unspecified hearing loss, unspecified ear: Secondary | ICD-10-CM | POA: Diagnosis not present

## 2019-12-08 DIAGNOSIS — K219 Gastro-esophageal reflux disease without esophagitis: Secondary | ICD-10-CM | POA: Diagnosis not present

## 2019-12-08 DIAGNOSIS — N183 Chronic kidney disease, stage 3 unspecified: Secondary | ICD-10-CM | POA: Diagnosis not present

## 2019-12-08 DIAGNOSIS — E1122 Type 2 diabetes mellitus with diabetic chronic kidney disease: Secondary | ICD-10-CM | POA: Diagnosis not present

## 2019-12-08 DIAGNOSIS — E0580 Other thyrotoxicosis without thyrotoxic crisis or storm: Secondary | ICD-10-CM | POA: Diagnosis not present

## 2019-12-08 DIAGNOSIS — I129 Hypertensive chronic kidney disease with stage 1 through stage 4 chronic kidney disease, or unspecified chronic kidney disease: Secondary | ICD-10-CM | POA: Diagnosis not present

## 2019-12-08 DIAGNOSIS — E041 Nontoxic single thyroid nodule: Secondary | ICD-10-CM | POA: Diagnosis not present

## 2019-12-08 DIAGNOSIS — D869 Sarcoidosis, unspecified: Secondary | ICD-10-CM | POA: Diagnosis not present

## 2019-12-08 DIAGNOSIS — M1990 Unspecified osteoarthritis, unspecified site: Secondary | ICD-10-CM | POA: Diagnosis not present

## 2019-12-08 DIAGNOSIS — E782 Mixed hyperlipidemia: Secondary | ICD-10-CM | POA: Diagnosis not present

## 2019-12-08 DIAGNOSIS — G4733 Obstructive sleep apnea (adult) (pediatric): Secondary | ICD-10-CM | POA: Diagnosis not present

## 2019-12-08 DIAGNOSIS — Z9981 Dependence on supplemental oxygen: Secondary | ICD-10-CM | POA: Diagnosis not present

## 2019-12-08 DIAGNOSIS — K449 Diaphragmatic hernia without obstruction or gangrene: Secondary | ICD-10-CM | POA: Diagnosis not present

## 2019-12-08 DIAGNOSIS — J449 Chronic obstructive pulmonary disease, unspecified: Secondary | ICD-10-CM | POA: Diagnosis not present

## 2019-12-08 DIAGNOSIS — M7502 Adhesive capsulitis of left shoulder: Secondary | ICD-10-CM | POA: Diagnosis not present

## 2019-12-12 DIAGNOSIS — E782 Mixed hyperlipidemia: Secondary | ICD-10-CM | POA: Diagnosis not present

## 2019-12-12 DIAGNOSIS — K219 Gastro-esophageal reflux disease without esophagitis: Secondary | ICD-10-CM | POA: Diagnosis not present

## 2019-12-12 DIAGNOSIS — I129 Hypertensive chronic kidney disease with stage 1 through stage 4 chronic kidney disease, or unspecified chronic kidney disease: Secondary | ICD-10-CM | POA: Diagnosis not present

## 2019-12-12 DIAGNOSIS — G4733 Obstructive sleep apnea (adult) (pediatric): Secondary | ICD-10-CM | POA: Diagnosis not present

## 2019-12-12 DIAGNOSIS — Z9981 Dependence on supplemental oxygen: Secondary | ICD-10-CM | POA: Diagnosis not present

## 2019-12-12 DIAGNOSIS — K449 Diaphragmatic hernia without obstruction or gangrene: Secondary | ICD-10-CM | POA: Diagnosis not present

## 2019-12-12 DIAGNOSIS — M7502 Adhesive capsulitis of left shoulder: Secondary | ICD-10-CM | POA: Diagnosis not present

## 2019-12-12 DIAGNOSIS — E1122 Type 2 diabetes mellitus with diabetic chronic kidney disease: Secondary | ICD-10-CM | POA: Diagnosis not present

## 2019-12-12 DIAGNOSIS — E0580 Other thyrotoxicosis without thyrotoxic crisis or storm: Secondary | ICD-10-CM | POA: Diagnosis not present

## 2019-12-12 DIAGNOSIS — H9190 Unspecified hearing loss, unspecified ear: Secondary | ICD-10-CM | POA: Diagnosis not present

## 2019-12-12 DIAGNOSIS — G2581 Restless legs syndrome: Secondary | ICD-10-CM | POA: Diagnosis not present

## 2019-12-12 DIAGNOSIS — E041 Nontoxic single thyroid nodule: Secondary | ICD-10-CM | POA: Diagnosis not present

## 2019-12-12 DIAGNOSIS — M1990 Unspecified osteoarthritis, unspecified site: Secondary | ICD-10-CM | POA: Diagnosis not present

## 2019-12-12 DIAGNOSIS — D869 Sarcoidosis, unspecified: Secondary | ICD-10-CM | POA: Diagnosis not present

## 2019-12-12 DIAGNOSIS — N183 Chronic kidney disease, stage 3 unspecified: Secondary | ICD-10-CM | POA: Diagnosis not present

## 2019-12-12 DIAGNOSIS — J449 Chronic obstructive pulmonary disease, unspecified: Secondary | ICD-10-CM | POA: Diagnosis not present

## 2019-12-15 DIAGNOSIS — E1122 Type 2 diabetes mellitus with diabetic chronic kidney disease: Secondary | ICD-10-CM | POA: Diagnosis not present

## 2019-12-15 DIAGNOSIS — J449 Chronic obstructive pulmonary disease, unspecified: Secondary | ICD-10-CM | POA: Diagnosis not present

## 2019-12-15 DIAGNOSIS — N183 Chronic kidney disease, stage 3 unspecified: Secondary | ICD-10-CM | POA: Diagnosis not present

## 2019-12-15 DIAGNOSIS — H9190 Unspecified hearing loss, unspecified ear: Secondary | ICD-10-CM | POA: Diagnosis not present

## 2019-12-15 DIAGNOSIS — M1990 Unspecified osteoarthritis, unspecified site: Secondary | ICD-10-CM | POA: Diagnosis not present

## 2019-12-15 DIAGNOSIS — I129 Hypertensive chronic kidney disease with stage 1 through stage 4 chronic kidney disease, or unspecified chronic kidney disease: Secondary | ICD-10-CM | POA: Diagnosis not present

## 2019-12-15 DIAGNOSIS — E041 Nontoxic single thyroid nodule: Secondary | ICD-10-CM | POA: Diagnosis not present

## 2019-12-15 DIAGNOSIS — K449 Diaphragmatic hernia without obstruction or gangrene: Secondary | ICD-10-CM | POA: Diagnosis not present

## 2019-12-15 DIAGNOSIS — K219 Gastro-esophageal reflux disease without esophagitis: Secondary | ICD-10-CM | POA: Diagnosis not present

## 2019-12-15 DIAGNOSIS — D869 Sarcoidosis, unspecified: Secondary | ICD-10-CM | POA: Diagnosis not present

## 2019-12-15 DIAGNOSIS — Z9981 Dependence on supplemental oxygen: Secondary | ICD-10-CM | POA: Diagnosis not present

## 2019-12-15 DIAGNOSIS — G2581 Restless legs syndrome: Secondary | ICD-10-CM | POA: Diagnosis not present

## 2019-12-15 DIAGNOSIS — M7502 Adhesive capsulitis of left shoulder: Secondary | ICD-10-CM | POA: Diagnosis not present

## 2019-12-15 DIAGNOSIS — E782 Mixed hyperlipidemia: Secondary | ICD-10-CM | POA: Diagnosis not present

## 2019-12-15 DIAGNOSIS — E0580 Other thyrotoxicosis without thyrotoxic crisis or storm: Secondary | ICD-10-CM | POA: Diagnosis not present

## 2019-12-15 DIAGNOSIS — G4733 Obstructive sleep apnea (adult) (pediatric): Secondary | ICD-10-CM | POA: Diagnosis not present

## 2019-12-19 DIAGNOSIS — J449 Chronic obstructive pulmonary disease, unspecified: Secondary | ICD-10-CM | POA: Diagnosis not present

## 2019-12-19 DIAGNOSIS — I129 Hypertensive chronic kidney disease with stage 1 through stage 4 chronic kidney disease, or unspecified chronic kidney disease: Secondary | ICD-10-CM | POA: Diagnosis not present

## 2019-12-19 DIAGNOSIS — G4733 Obstructive sleep apnea (adult) (pediatric): Secondary | ICD-10-CM | POA: Diagnosis not present

## 2019-12-19 DIAGNOSIS — Z9981 Dependence on supplemental oxygen: Secondary | ICD-10-CM | POA: Diagnosis not present

## 2019-12-19 DIAGNOSIS — K219 Gastro-esophageal reflux disease without esophagitis: Secondary | ICD-10-CM | POA: Diagnosis not present

## 2019-12-19 DIAGNOSIS — N183 Chronic kidney disease, stage 3 unspecified: Secondary | ICD-10-CM | POA: Diagnosis not present

## 2019-12-19 DIAGNOSIS — E1122 Type 2 diabetes mellitus with diabetic chronic kidney disease: Secondary | ICD-10-CM | POA: Diagnosis not present

## 2019-12-19 DIAGNOSIS — E041 Nontoxic single thyroid nodule: Secondary | ICD-10-CM | POA: Diagnosis not present

## 2019-12-19 DIAGNOSIS — M1990 Unspecified osteoarthritis, unspecified site: Secondary | ICD-10-CM | POA: Diagnosis not present

## 2019-12-19 DIAGNOSIS — D869 Sarcoidosis, unspecified: Secondary | ICD-10-CM | POA: Diagnosis not present

## 2019-12-19 DIAGNOSIS — G2581 Restless legs syndrome: Secondary | ICD-10-CM | POA: Diagnosis not present

## 2019-12-19 DIAGNOSIS — E0580 Other thyrotoxicosis without thyrotoxic crisis or storm: Secondary | ICD-10-CM | POA: Diagnosis not present

## 2019-12-19 DIAGNOSIS — H9190 Unspecified hearing loss, unspecified ear: Secondary | ICD-10-CM | POA: Diagnosis not present

## 2019-12-19 DIAGNOSIS — K449 Diaphragmatic hernia without obstruction or gangrene: Secondary | ICD-10-CM | POA: Diagnosis not present

## 2019-12-19 DIAGNOSIS — E782 Mixed hyperlipidemia: Secondary | ICD-10-CM | POA: Diagnosis not present

## 2019-12-19 DIAGNOSIS — M7502 Adhesive capsulitis of left shoulder: Secondary | ICD-10-CM | POA: Diagnosis not present

## 2019-12-20 DIAGNOSIS — E1122 Type 2 diabetes mellitus with diabetic chronic kidney disease: Secondary | ICD-10-CM | POA: Diagnosis not present

## 2019-12-20 DIAGNOSIS — G2581 Restless legs syndrome: Secondary | ICD-10-CM | POA: Diagnosis not present

## 2019-12-20 DIAGNOSIS — M7502 Adhesive capsulitis of left shoulder: Secondary | ICD-10-CM | POA: Diagnosis not present

## 2019-12-20 DIAGNOSIS — I129 Hypertensive chronic kidney disease with stage 1 through stage 4 chronic kidney disease, or unspecified chronic kidney disease: Secondary | ICD-10-CM | POA: Diagnosis not present

## 2019-12-20 DIAGNOSIS — D869 Sarcoidosis, unspecified: Secondary | ICD-10-CM | POA: Diagnosis not present

## 2019-12-20 DIAGNOSIS — E0580 Other thyrotoxicosis without thyrotoxic crisis or storm: Secondary | ICD-10-CM | POA: Diagnosis not present

## 2019-12-20 DIAGNOSIS — K449 Diaphragmatic hernia without obstruction or gangrene: Secondary | ICD-10-CM | POA: Diagnosis not present

## 2019-12-20 DIAGNOSIS — E041 Nontoxic single thyroid nodule: Secondary | ICD-10-CM | POA: Diagnosis not present

## 2019-12-20 DIAGNOSIS — Z9981 Dependence on supplemental oxygen: Secondary | ICD-10-CM | POA: Diagnosis not present

## 2019-12-20 DIAGNOSIS — E782 Mixed hyperlipidemia: Secondary | ICD-10-CM | POA: Diagnosis not present

## 2019-12-20 DIAGNOSIS — J449 Chronic obstructive pulmonary disease, unspecified: Secondary | ICD-10-CM | POA: Diagnosis not present

## 2019-12-20 DIAGNOSIS — H9190 Unspecified hearing loss, unspecified ear: Secondary | ICD-10-CM | POA: Diagnosis not present

## 2019-12-20 DIAGNOSIS — N183 Chronic kidney disease, stage 3 unspecified: Secondary | ICD-10-CM | POA: Diagnosis not present

## 2019-12-20 DIAGNOSIS — K219 Gastro-esophageal reflux disease without esophagitis: Secondary | ICD-10-CM | POA: Diagnosis not present

## 2019-12-20 DIAGNOSIS — G4733 Obstructive sleep apnea (adult) (pediatric): Secondary | ICD-10-CM | POA: Diagnosis not present

## 2019-12-20 DIAGNOSIS — M1990 Unspecified osteoarthritis, unspecified site: Secondary | ICD-10-CM | POA: Diagnosis not present

## 2019-12-21 ENCOUNTER — Other Ambulatory Visit: Payer: Self-pay

## 2019-12-21 ENCOUNTER — Ambulatory Visit (INDEPENDENT_AMBULATORY_CARE_PROVIDER_SITE_OTHER): Payer: Medicare Other

## 2019-12-21 DIAGNOSIS — I313 Pericardial effusion (noninflammatory): Secondary | ICD-10-CM

## 2019-12-21 DIAGNOSIS — I3139 Other pericardial effusion (noninflammatory): Secondary | ICD-10-CM

## 2019-12-22 DIAGNOSIS — M7502 Adhesive capsulitis of left shoulder: Secondary | ICD-10-CM | POA: Diagnosis not present

## 2019-12-22 DIAGNOSIS — G2581 Restless legs syndrome: Secondary | ICD-10-CM | POA: Diagnosis not present

## 2019-12-22 DIAGNOSIS — E041 Nontoxic single thyroid nodule: Secondary | ICD-10-CM | POA: Diagnosis not present

## 2019-12-22 DIAGNOSIS — K449 Diaphragmatic hernia without obstruction or gangrene: Secondary | ICD-10-CM | POA: Diagnosis not present

## 2019-12-22 DIAGNOSIS — K219 Gastro-esophageal reflux disease without esophagitis: Secondary | ICD-10-CM | POA: Diagnosis not present

## 2019-12-22 DIAGNOSIS — G4733 Obstructive sleep apnea (adult) (pediatric): Secondary | ICD-10-CM | POA: Diagnosis not present

## 2019-12-22 DIAGNOSIS — H9190 Unspecified hearing loss, unspecified ear: Secondary | ICD-10-CM | POA: Diagnosis not present

## 2019-12-22 DIAGNOSIS — N183 Chronic kidney disease, stage 3 unspecified: Secondary | ICD-10-CM | POA: Diagnosis not present

## 2019-12-22 DIAGNOSIS — Z9981 Dependence on supplemental oxygen: Secondary | ICD-10-CM | POA: Diagnosis not present

## 2019-12-22 DIAGNOSIS — E0580 Other thyrotoxicosis without thyrotoxic crisis or storm: Secondary | ICD-10-CM | POA: Diagnosis not present

## 2019-12-22 DIAGNOSIS — M1990 Unspecified osteoarthritis, unspecified site: Secondary | ICD-10-CM | POA: Diagnosis not present

## 2019-12-22 DIAGNOSIS — E782 Mixed hyperlipidemia: Secondary | ICD-10-CM | POA: Diagnosis not present

## 2019-12-22 DIAGNOSIS — J449 Chronic obstructive pulmonary disease, unspecified: Secondary | ICD-10-CM | POA: Diagnosis not present

## 2019-12-22 DIAGNOSIS — E1122 Type 2 diabetes mellitus with diabetic chronic kidney disease: Secondary | ICD-10-CM | POA: Diagnosis not present

## 2019-12-22 DIAGNOSIS — D869 Sarcoidosis, unspecified: Secondary | ICD-10-CM | POA: Diagnosis not present

## 2019-12-22 DIAGNOSIS — I129 Hypertensive chronic kidney disease with stage 1 through stage 4 chronic kidney disease, or unspecified chronic kidney disease: Secondary | ICD-10-CM | POA: Diagnosis not present

## 2019-12-23 ENCOUNTER — Telehealth: Payer: Self-pay | Admitting: *Deleted

## 2019-12-23 NOTE — Telephone Encounter (Signed)
-----   Message from Laqueta Linden, MD sent at 12/21/2019  3:34 PM EDT ----- Pumping function is normal.  Fluid collection around the heart is very small and insignificant.

## 2019-12-23 NOTE — Telephone Encounter (Signed)
Patient informed. Copy sent to PCP °

## 2019-12-26 DIAGNOSIS — E1122 Type 2 diabetes mellitus with diabetic chronic kidney disease: Secondary | ICD-10-CM | POA: Diagnosis not present

## 2019-12-26 DIAGNOSIS — E7849 Other hyperlipidemia: Secondary | ICD-10-CM | POA: Diagnosis not present

## 2019-12-26 DIAGNOSIS — N183 Chronic kidney disease, stage 3 unspecified: Secondary | ICD-10-CM | POA: Diagnosis not present

## 2019-12-26 DIAGNOSIS — I129 Hypertensive chronic kidney disease with stage 1 through stage 4 chronic kidney disease, or unspecified chronic kidney disease: Secondary | ICD-10-CM | POA: Diagnosis not present

## 2019-12-29 DIAGNOSIS — G2581 Restless legs syndrome: Secondary | ICD-10-CM | POA: Diagnosis not present

## 2019-12-29 DIAGNOSIS — Z9981 Dependence on supplemental oxygen: Secondary | ICD-10-CM | POA: Diagnosis not present

## 2019-12-29 DIAGNOSIS — E782 Mixed hyperlipidemia: Secondary | ICD-10-CM | POA: Diagnosis not present

## 2019-12-29 DIAGNOSIS — E0580 Other thyrotoxicosis without thyrotoxic crisis or storm: Secondary | ICD-10-CM | POA: Diagnosis not present

## 2019-12-29 DIAGNOSIS — I129 Hypertensive chronic kidney disease with stage 1 through stage 4 chronic kidney disease, or unspecified chronic kidney disease: Secondary | ICD-10-CM | POA: Diagnosis not present

## 2019-12-29 DIAGNOSIS — N183 Chronic kidney disease, stage 3 unspecified: Secondary | ICD-10-CM | POA: Diagnosis not present

## 2019-12-29 DIAGNOSIS — M7502 Adhesive capsulitis of left shoulder: Secondary | ICD-10-CM | POA: Diagnosis not present

## 2019-12-29 DIAGNOSIS — G4733 Obstructive sleep apnea (adult) (pediatric): Secondary | ICD-10-CM | POA: Diagnosis not present

## 2019-12-29 DIAGNOSIS — E1122 Type 2 diabetes mellitus with diabetic chronic kidney disease: Secondary | ICD-10-CM | POA: Diagnosis not present

## 2019-12-29 DIAGNOSIS — M1990 Unspecified osteoarthritis, unspecified site: Secondary | ICD-10-CM | POA: Diagnosis not present

## 2019-12-29 DIAGNOSIS — D869 Sarcoidosis, unspecified: Secondary | ICD-10-CM | POA: Diagnosis not present

## 2019-12-29 DIAGNOSIS — K219 Gastro-esophageal reflux disease without esophagitis: Secondary | ICD-10-CM | POA: Diagnosis not present

## 2019-12-29 DIAGNOSIS — H9190 Unspecified hearing loss, unspecified ear: Secondary | ICD-10-CM | POA: Diagnosis not present

## 2019-12-29 DIAGNOSIS — K449 Diaphragmatic hernia without obstruction or gangrene: Secondary | ICD-10-CM | POA: Diagnosis not present

## 2019-12-29 DIAGNOSIS — E041 Nontoxic single thyroid nodule: Secondary | ICD-10-CM | POA: Diagnosis not present

## 2019-12-29 DIAGNOSIS — J449 Chronic obstructive pulmonary disease, unspecified: Secondary | ICD-10-CM | POA: Diagnosis not present

## 2020-01-02 DIAGNOSIS — J984 Other disorders of lung: Secondary | ICD-10-CM | POA: Diagnosis not present

## 2020-01-02 DIAGNOSIS — I129 Hypertensive chronic kidney disease with stage 1 through stage 4 chronic kidney disease, or unspecified chronic kidney disease: Secondary | ICD-10-CM | POA: Diagnosis not present

## 2020-01-02 DIAGNOSIS — N189 Chronic kidney disease, unspecified: Secondary | ICD-10-CM | POA: Diagnosis not present

## 2020-01-02 DIAGNOSIS — N1832 Chronic kidney disease, stage 3b: Secondary | ICD-10-CM | POA: Diagnosis not present

## 2020-01-03 DIAGNOSIS — D869 Sarcoidosis, unspecified: Secondary | ICD-10-CM | POA: Diagnosis not present

## 2020-01-03 DIAGNOSIS — J449 Chronic obstructive pulmonary disease, unspecified: Secondary | ICD-10-CM | POA: Diagnosis not present

## 2020-01-03 DIAGNOSIS — Z9981 Dependence on supplemental oxygen: Secondary | ICD-10-CM | POA: Diagnosis not present

## 2020-01-03 DIAGNOSIS — I129 Hypertensive chronic kidney disease with stage 1 through stage 4 chronic kidney disease, or unspecified chronic kidney disease: Secondary | ICD-10-CM | POA: Diagnosis not present

## 2020-01-03 DIAGNOSIS — H9190 Unspecified hearing loss, unspecified ear: Secondary | ICD-10-CM | POA: Diagnosis not present

## 2020-01-03 DIAGNOSIS — E041 Nontoxic single thyroid nodule: Secondary | ICD-10-CM | POA: Diagnosis not present

## 2020-01-03 DIAGNOSIS — G2581 Restless legs syndrome: Secondary | ICD-10-CM | POA: Diagnosis not present

## 2020-01-03 DIAGNOSIS — K449 Diaphragmatic hernia without obstruction or gangrene: Secondary | ICD-10-CM | POA: Diagnosis not present

## 2020-01-03 DIAGNOSIS — G4733 Obstructive sleep apnea (adult) (pediatric): Secondary | ICD-10-CM | POA: Diagnosis not present

## 2020-01-03 DIAGNOSIS — N183 Chronic kidney disease, stage 3 unspecified: Secondary | ICD-10-CM | POA: Diagnosis not present

## 2020-01-03 DIAGNOSIS — K219 Gastro-esophageal reflux disease without esophagitis: Secondary | ICD-10-CM | POA: Diagnosis not present

## 2020-01-03 DIAGNOSIS — M7502 Adhesive capsulitis of left shoulder: Secondary | ICD-10-CM | POA: Diagnosis not present

## 2020-01-03 DIAGNOSIS — M1990 Unspecified osteoarthritis, unspecified site: Secondary | ICD-10-CM | POA: Diagnosis not present

## 2020-01-03 DIAGNOSIS — E0580 Other thyrotoxicosis without thyrotoxic crisis or storm: Secondary | ICD-10-CM | POA: Diagnosis not present

## 2020-01-03 DIAGNOSIS — E1122 Type 2 diabetes mellitus with diabetic chronic kidney disease: Secondary | ICD-10-CM | POA: Diagnosis not present

## 2020-01-03 DIAGNOSIS — E782 Mixed hyperlipidemia: Secondary | ICD-10-CM | POA: Diagnosis not present

## 2020-01-05 DIAGNOSIS — I129 Hypertensive chronic kidney disease with stage 1 through stage 4 chronic kidney disease, or unspecified chronic kidney disease: Secondary | ICD-10-CM | POA: Diagnosis not present

## 2020-01-05 DIAGNOSIS — E041 Nontoxic single thyroid nodule: Secondary | ICD-10-CM | POA: Diagnosis not present

## 2020-01-05 DIAGNOSIS — K219 Gastro-esophageal reflux disease without esophagitis: Secondary | ICD-10-CM | POA: Diagnosis not present

## 2020-01-05 DIAGNOSIS — H9190 Unspecified hearing loss, unspecified ear: Secondary | ICD-10-CM | POA: Diagnosis not present

## 2020-01-05 DIAGNOSIS — N183 Chronic kidney disease, stage 3 unspecified: Secondary | ICD-10-CM | POA: Diagnosis not present

## 2020-01-05 DIAGNOSIS — M1990 Unspecified osteoarthritis, unspecified site: Secondary | ICD-10-CM | POA: Diagnosis not present

## 2020-01-05 DIAGNOSIS — E0580 Other thyrotoxicosis without thyrotoxic crisis or storm: Secondary | ICD-10-CM | POA: Diagnosis not present

## 2020-01-05 DIAGNOSIS — E1122 Type 2 diabetes mellitus with diabetic chronic kidney disease: Secondary | ICD-10-CM | POA: Diagnosis not present

## 2020-01-05 DIAGNOSIS — M7502 Adhesive capsulitis of left shoulder: Secondary | ICD-10-CM | POA: Diagnosis not present

## 2020-01-05 DIAGNOSIS — Z9981 Dependence on supplemental oxygen: Secondary | ICD-10-CM | POA: Diagnosis not present

## 2020-01-05 DIAGNOSIS — G2581 Restless legs syndrome: Secondary | ICD-10-CM | POA: Diagnosis not present

## 2020-01-05 DIAGNOSIS — D869 Sarcoidosis, unspecified: Secondary | ICD-10-CM | POA: Diagnosis not present

## 2020-01-05 DIAGNOSIS — E782 Mixed hyperlipidemia: Secondary | ICD-10-CM | POA: Diagnosis not present

## 2020-01-05 DIAGNOSIS — G4733 Obstructive sleep apnea (adult) (pediatric): Secondary | ICD-10-CM | POA: Diagnosis not present

## 2020-01-05 DIAGNOSIS — J449 Chronic obstructive pulmonary disease, unspecified: Secondary | ICD-10-CM | POA: Diagnosis not present

## 2020-01-05 DIAGNOSIS — K449 Diaphragmatic hernia without obstruction or gangrene: Secondary | ICD-10-CM | POA: Diagnosis not present

## 2020-01-09 DIAGNOSIS — E0580 Other thyrotoxicosis without thyrotoxic crisis or storm: Secondary | ICD-10-CM | POA: Diagnosis not present

## 2020-01-09 DIAGNOSIS — N183 Chronic kidney disease, stage 3 unspecified: Secondary | ICD-10-CM | POA: Diagnosis not present

## 2020-01-09 DIAGNOSIS — K449 Diaphragmatic hernia without obstruction or gangrene: Secondary | ICD-10-CM | POA: Diagnosis not present

## 2020-01-09 DIAGNOSIS — E1122 Type 2 diabetes mellitus with diabetic chronic kidney disease: Secondary | ICD-10-CM | POA: Diagnosis not present

## 2020-01-09 DIAGNOSIS — G4733 Obstructive sleep apnea (adult) (pediatric): Secondary | ICD-10-CM | POA: Diagnosis not present

## 2020-01-09 DIAGNOSIS — H9190 Unspecified hearing loss, unspecified ear: Secondary | ICD-10-CM | POA: Diagnosis not present

## 2020-01-09 DIAGNOSIS — E041 Nontoxic single thyroid nodule: Secondary | ICD-10-CM | POA: Diagnosis not present

## 2020-01-09 DIAGNOSIS — E782 Mixed hyperlipidemia: Secondary | ICD-10-CM | POA: Diagnosis not present

## 2020-01-09 DIAGNOSIS — D869 Sarcoidosis, unspecified: Secondary | ICD-10-CM | POA: Diagnosis not present

## 2020-01-09 DIAGNOSIS — Z9981 Dependence on supplemental oxygen: Secondary | ICD-10-CM | POA: Diagnosis not present

## 2020-01-09 DIAGNOSIS — I129 Hypertensive chronic kidney disease with stage 1 through stage 4 chronic kidney disease, or unspecified chronic kidney disease: Secondary | ICD-10-CM | POA: Diagnosis not present

## 2020-01-09 DIAGNOSIS — G2581 Restless legs syndrome: Secondary | ICD-10-CM | POA: Diagnosis not present

## 2020-01-09 DIAGNOSIS — M1990 Unspecified osteoarthritis, unspecified site: Secondary | ICD-10-CM | POA: Diagnosis not present

## 2020-01-09 DIAGNOSIS — M7502 Adhesive capsulitis of left shoulder: Secondary | ICD-10-CM | POA: Diagnosis not present

## 2020-01-09 DIAGNOSIS — J449 Chronic obstructive pulmonary disease, unspecified: Secondary | ICD-10-CM | POA: Diagnosis not present

## 2020-01-09 DIAGNOSIS — K219 Gastro-esophageal reflux disease without esophagitis: Secondary | ICD-10-CM | POA: Diagnosis not present

## 2020-01-10 DIAGNOSIS — K219 Gastro-esophageal reflux disease without esophagitis: Secondary | ICD-10-CM | POA: Diagnosis not present

## 2020-01-10 DIAGNOSIS — H9190 Unspecified hearing loss, unspecified ear: Secondary | ICD-10-CM | POA: Diagnosis not present

## 2020-01-10 DIAGNOSIS — M7502 Adhesive capsulitis of left shoulder: Secondary | ICD-10-CM | POA: Diagnosis not present

## 2020-01-10 DIAGNOSIS — K449 Diaphragmatic hernia without obstruction or gangrene: Secondary | ICD-10-CM | POA: Diagnosis not present

## 2020-01-10 DIAGNOSIS — I129 Hypertensive chronic kidney disease with stage 1 through stage 4 chronic kidney disease, or unspecified chronic kidney disease: Secondary | ICD-10-CM | POA: Diagnosis not present

## 2020-01-10 DIAGNOSIS — Z9981 Dependence on supplemental oxygen: Secondary | ICD-10-CM | POA: Diagnosis not present

## 2020-01-10 DIAGNOSIS — J449 Chronic obstructive pulmonary disease, unspecified: Secondary | ICD-10-CM | POA: Diagnosis not present

## 2020-01-10 DIAGNOSIS — N183 Chronic kidney disease, stage 3 unspecified: Secondary | ICD-10-CM | POA: Diagnosis not present

## 2020-01-10 DIAGNOSIS — G4733 Obstructive sleep apnea (adult) (pediatric): Secondary | ICD-10-CM | POA: Diagnosis not present

## 2020-01-10 DIAGNOSIS — E782 Mixed hyperlipidemia: Secondary | ICD-10-CM | POA: Diagnosis not present

## 2020-01-10 DIAGNOSIS — D869 Sarcoidosis, unspecified: Secondary | ICD-10-CM | POA: Diagnosis not present

## 2020-01-10 DIAGNOSIS — E041 Nontoxic single thyroid nodule: Secondary | ICD-10-CM | POA: Diagnosis not present

## 2020-01-10 DIAGNOSIS — E1122 Type 2 diabetes mellitus with diabetic chronic kidney disease: Secondary | ICD-10-CM | POA: Diagnosis not present

## 2020-01-10 DIAGNOSIS — G2581 Restless legs syndrome: Secondary | ICD-10-CM | POA: Diagnosis not present

## 2020-01-10 DIAGNOSIS — M1990 Unspecified osteoarthritis, unspecified site: Secondary | ICD-10-CM | POA: Diagnosis not present

## 2020-01-10 DIAGNOSIS — E0580 Other thyrotoxicosis without thyrotoxic crisis or storm: Secondary | ICD-10-CM | POA: Diagnosis not present

## 2020-01-11 DIAGNOSIS — J9610 Chronic respiratory failure, unspecified whether with hypoxia or hypercapnia: Secondary | ICD-10-CM | POA: Diagnosis not present

## 2020-01-16 DIAGNOSIS — E782 Mixed hyperlipidemia: Secondary | ICD-10-CM | POA: Diagnosis not present

## 2020-01-16 DIAGNOSIS — H9190 Unspecified hearing loss, unspecified ear: Secondary | ICD-10-CM | POA: Diagnosis not present

## 2020-01-16 DIAGNOSIS — Z9981 Dependence on supplemental oxygen: Secondary | ICD-10-CM | POA: Diagnosis not present

## 2020-01-16 DIAGNOSIS — K219 Gastro-esophageal reflux disease without esophagitis: Secondary | ICD-10-CM | POA: Diagnosis not present

## 2020-01-16 DIAGNOSIS — E1122 Type 2 diabetes mellitus with diabetic chronic kidney disease: Secondary | ICD-10-CM | POA: Diagnosis not present

## 2020-01-16 DIAGNOSIS — M7502 Adhesive capsulitis of left shoulder: Secondary | ICD-10-CM | POA: Diagnosis not present

## 2020-01-16 DIAGNOSIS — E0580 Other thyrotoxicosis without thyrotoxic crisis or storm: Secondary | ICD-10-CM | POA: Diagnosis not present

## 2020-01-16 DIAGNOSIS — N183 Chronic kidney disease, stage 3 unspecified: Secondary | ICD-10-CM | POA: Diagnosis not present

## 2020-01-16 DIAGNOSIS — D869 Sarcoidosis, unspecified: Secondary | ICD-10-CM | POA: Diagnosis not present

## 2020-01-16 DIAGNOSIS — M1990 Unspecified osteoarthritis, unspecified site: Secondary | ICD-10-CM | POA: Diagnosis not present

## 2020-01-16 DIAGNOSIS — G4733 Obstructive sleep apnea (adult) (pediatric): Secondary | ICD-10-CM | POA: Diagnosis not present

## 2020-01-16 DIAGNOSIS — I129 Hypertensive chronic kidney disease with stage 1 through stage 4 chronic kidney disease, or unspecified chronic kidney disease: Secondary | ICD-10-CM | POA: Diagnosis not present

## 2020-01-16 DIAGNOSIS — K449 Diaphragmatic hernia without obstruction or gangrene: Secondary | ICD-10-CM | POA: Diagnosis not present

## 2020-01-16 DIAGNOSIS — E041 Nontoxic single thyroid nodule: Secondary | ICD-10-CM | POA: Diagnosis not present

## 2020-01-16 DIAGNOSIS — J449 Chronic obstructive pulmonary disease, unspecified: Secondary | ICD-10-CM | POA: Diagnosis not present

## 2020-01-16 DIAGNOSIS — G2581 Restless legs syndrome: Secondary | ICD-10-CM | POA: Diagnosis not present

## 2020-01-17 DIAGNOSIS — R062 Wheezing: Secondary | ICD-10-CM | POA: Diagnosis not present

## 2020-01-17 DIAGNOSIS — E041 Nontoxic single thyroid nodule: Secondary | ICD-10-CM | POA: Diagnosis not present

## 2020-01-17 DIAGNOSIS — E1122 Type 2 diabetes mellitus with diabetic chronic kidney disease: Secondary | ICD-10-CM | POA: Diagnosis not present

## 2020-01-17 DIAGNOSIS — D869 Sarcoidosis, unspecified: Secondary | ICD-10-CM | POA: Diagnosis not present

## 2020-01-17 DIAGNOSIS — I129 Hypertensive chronic kidney disease with stage 1 through stage 4 chronic kidney disease, or unspecified chronic kidney disease: Secondary | ICD-10-CM | POA: Diagnosis not present

## 2020-01-17 DIAGNOSIS — G2581 Restless legs syndrome: Secondary | ICD-10-CM | POA: Diagnosis not present

## 2020-01-17 DIAGNOSIS — M1990 Unspecified osteoarthritis, unspecified site: Secondary | ICD-10-CM | POA: Diagnosis not present

## 2020-01-17 DIAGNOSIS — H9190 Unspecified hearing loss, unspecified ear: Secondary | ICD-10-CM | POA: Diagnosis not present

## 2020-01-17 DIAGNOSIS — J449 Chronic obstructive pulmonary disease, unspecified: Secondary | ICD-10-CM | POA: Diagnosis not present

## 2020-01-17 DIAGNOSIS — Z9981 Dependence on supplemental oxygen: Secondary | ICD-10-CM | POA: Diagnosis not present

## 2020-01-17 DIAGNOSIS — R269 Unspecified abnormalities of gait and mobility: Secondary | ICD-10-CM | POA: Diagnosis not present

## 2020-01-17 DIAGNOSIS — K449 Diaphragmatic hernia without obstruction or gangrene: Secondary | ICD-10-CM | POA: Diagnosis not present

## 2020-01-17 DIAGNOSIS — D860 Sarcoidosis of lung: Secondary | ICD-10-CM | POA: Diagnosis not present

## 2020-01-17 DIAGNOSIS — N183 Chronic kidney disease, stage 3 unspecified: Secondary | ICD-10-CM | POA: Diagnosis not present

## 2020-01-17 DIAGNOSIS — G4733 Obstructive sleep apnea (adult) (pediatric): Secondary | ICD-10-CM | POA: Diagnosis not present

## 2020-01-17 DIAGNOSIS — E782 Mixed hyperlipidemia: Secondary | ICD-10-CM | POA: Diagnosis not present

## 2020-01-17 DIAGNOSIS — E0580 Other thyrotoxicosis without thyrotoxic crisis or storm: Secondary | ICD-10-CM | POA: Diagnosis not present

## 2020-01-17 DIAGNOSIS — M7502 Adhesive capsulitis of left shoulder: Secondary | ICD-10-CM | POA: Diagnosis not present

## 2020-01-17 DIAGNOSIS — K219 Gastro-esophageal reflux disease without esophagitis: Secondary | ICD-10-CM | POA: Diagnosis not present

## 2020-01-24 DIAGNOSIS — G4733 Obstructive sleep apnea (adult) (pediatric): Secondary | ICD-10-CM | POA: Diagnosis not present

## 2020-01-24 DIAGNOSIS — E041 Nontoxic single thyroid nodule: Secondary | ICD-10-CM | POA: Diagnosis not present

## 2020-01-24 DIAGNOSIS — H9190 Unspecified hearing loss, unspecified ear: Secondary | ICD-10-CM | POA: Diagnosis not present

## 2020-01-24 DIAGNOSIS — Z9981 Dependence on supplemental oxygen: Secondary | ICD-10-CM | POA: Diagnosis not present

## 2020-01-24 DIAGNOSIS — J449 Chronic obstructive pulmonary disease, unspecified: Secondary | ICD-10-CM | POA: Diagnosis not present

## 2020-01-24 DIAGNOSIS — M1990 Unspecified osteoarthritis, unspecified site: Secondary | ICD-10-CM | POA: Diagnosis not present

## 2020-01-24 DIAGNOSIS — E1122 Type 2 diabetes mellitus with diabetic chronic kidney disease: Secondary | ICD-10-CM | POA: Diagnosis not present

## 2020-01-24 DIAGNOSIS — D869 Sarcoidosis, unspecified: Secondary | ICD-10-CM | POA: Diagnosis not present

## 2020-01-24 DIAGNOSIS — K449 Diaphragmatic hernia without obstruction or gangrene: Secondary | ICD-10-CM | POA: Diagnosis not present

## 2020-01-24 DIAGNOSIS — K219 Gastro-esophageal reflux disease without esophagitis: Secondary | ICD-10-CM | POA: Diagnosis not present

## 2020-01-24 DIAGNOSIS — E0580 Other thyrotoxicosis without thyrotoxic crisis or storm: Secondary | ICD-10-CM | POA: Diagnosis not present

## 2020-01-24 DIAGNOSIS — I129 Hypertensive chronic kidney disease with stage 1 through stage 4 chronic kidney disease, or unspecified chronic kidney disease: Secondary | ICD-10-CM | POA: Diagnosis not present

## 2020-01-24 DIAGNOSIS — M7502 Adhesive capsulitis of left shoulder: Secondary | ICD-10-CM | POA: Diagnosis not present

## 2020-01-24 DIAGNOSIS — G2581 Restless legs syndrome: Secondary | ICD-10-CM | POA: Diagnosis not present

## 2020-01-24 DIAGNOSIS — E782 Mixed hyperlipidemia: Secondary | ICD-10-CM | POA: Diagnosis not present

## 2020-01-24 DIAGNOSIS — N183 Chronic kidney disease, stage 3 unspecified: Secondary | ICD-10-CM | POA: Diagnosis not present

## 2020-01-25 DIAGNOSIS — I129 Hypertensive chronic kidney disease with stage 1 through stage 4 chronic kidney disease, or unspecified chronic kidney disease: Secondary | ICD-10-CM | POA: Diagnosis not present

## 2020-01-25 DIAGNOSIS — N183 Chronic kidney disease, stage 3 unspecified: Secondary | ICD-10-CM | POA: Diagnosis not present

## 2020-01-25 DIAGNOSIS — E7849 Other hyperlipidemia: Secondary | ICD-10-CM | POA: Diagnosis not present

## 2020-01-25 DIAGNOSIS — E1122 Type 2 diabetes mellitus with diabetic chronic kidney disease: Secondary | ICD-10-CM | POA: Diagnosis not present

## 2020-01-31 ENCOUNTER — Telehealth: Payer: Self-pay

## 2020-01-31 DIAGNOSIS — G2581 Restless legs syndrome: Secondary | ICD-10-CM | POA: Diagnosis not present

## 2020-01-31 DIAGNOSIS — E1122 Type 2 diabetes mellitus with diabetic chronic kidney disease: Secondary | ICD-10-CM | POA: Diagnosis not present

## 2020-01-31 DIAGNOSIS — E782 Mixed hyperlipidemia: Secondary | ICD-10-CM | POA: Diagnosis not present

## 2020-01-31 DIAGNOSIS — Z9981 Dependence on supplemental oxygen: Secondary | ICD-10-CM | POA: Diagnosis not present

## 2020-01-31 DIAGNOSIS — D869 Sarcoidosis, unspecified: Secondary | ICD-10-CM | POA: Diagnosis not present

## 2020-01-31 DIAGNOSIS — H9190 Unspecified hearing loss, unspecified ear: Secondary | ICD-10-CM | POA: Diagnosis not present

## 2020-01-31 DIAGNOSIS — M1990 Unspecified osteoarthritis, unspecified site: Secondary | ICD-10-CM | POA: Diagnosis not present

## 2020-01-31 DIAGNOSIS — K449 Diaphragmatic hernia without obstruction or gangrene: Secondary | ICD-10-CM | POA: Diagnosis not present

## 2020-01-31 DIAGNOSIS — M7502 Adhesive capsulitis of left shoulder: Secondary | ICD-10-CM | POA: Diagnosis not present

## 2020-01-31 DIAGNOSIS — J449 Chronic obstructive pulmonary disease, unspecified: Secondary | ICD-10-CM | POA: Diagnosis not present

## 2020-01-31 DIAGNOSIS — E041 Nontoxic single thyroid nodule: Secondary | ICD-10-CM | POA: Diagnosis not present

## 2020-01-31 DIAGNOSIS — G4733 Obstructive sleep apnea (adult) (pediatric): Secondary | ICD-10-CM | POA: Diagnosis not present

## 2020-01-31 DIAGNOSIS — K219 Gastro-esophageal reflux disease without esophagitis: Secondary | ICD-10-CM | POA: Diagnosis not present

## 2020-01-31 DIAGNOSIS — N183 Chronic kidney disease, stage 3 unspecified: Secondary | ICD-10-CM | POA: Diagnosis not present

## 2020-01-31 DIAGNOSIS — I129 Hypertensive chronic kidney disease with stage 1 through stage 4 chronic kidney disease, or unspecified chronic kidney disease: Secondary | ICD-10-CM | POA: Diagnosis not present

## 2020-01-31 DIAGNOSIS — E0580 Other thyrotoxicosis without thyrotoxic crisis or storm: Secondary | ICD-10-CM | POA: Diagnosis not present

## 2020-01-31 NOTE — Telephone Encounter (Signed)
Spoke with patient and she says that her home health nurse checked her BP today during her visit and BP was 140/110. Reports taking BP yesterday with her home monitor and BP was 142/70 & HR 72 Denies dizziness, chest pain or sob Advised to continue monitoring BP and contact our office if her BP stays elevated. Verbalized understanding.

## 2020-01-31 NOTE — Telephone Encounter (Signed)
FYI:  Malori from [facility] - elevated HR 140/110 patient is asymptomatic.

## 2020-01-31 NOTE — Telephone Encounter (Signed)
Thank You . Agree with plan is sustained BP over time equal to or greater than 130/80 will need to be treated with medication adjustment.

## 2020-02-03 DIAGNOSIS — N183 Chronic kidney disease, stage 3 unspecified: Secondary | ICD-10-CM | POA: Diagnosis not present

## 2020-02-03 DIAGNOSIS — E782 Mixed hyperlipidemia: Secondary | ICD-10-CM | POA: Diagnosis not present

## 2020-02-03 DIAGNOSIS — H9190 Unspecified hearing loss, unspecified ear: Secondary | ICD-10-CM | POA: Diagnosis not present

## 2020-02-03 DIAGNOSIS — G4733 Obstructive sleep apnea (adult) (pediatric): Secondary | ICD-10-CM | POA: Diagnosis not present

## 2020-02-03 DIAGNOSIS — M7502 Adhesive capsulitis of left shoulder: Secondary | ICD-10-CM | POA: Diagnosis not present

## 2020-02-03 DIAGNOSIS — K219 Gastro-esophageal reflux disease without esophagitis: Secondary | ICD-10-CM | POA: Diagnosis not present

## 2020-02-03 DIAGNOSIS — G2581 Restless legs syndrome: Secondary | ICD-10-CM | POA: Diagnosis not present

## 2020-02-03 DIAGNOSIS — I129 Hypertensive chronic kidney disease with stage 1 through stage 4 chronic kidney disease, or unspecified chronic kidney disease: Secondary | ICD-10-CM | POA: Diagnosis not present

## 2020-02-03 DIAGNOSIS — E1122 Type 2 diabetes mellitus with diabetic chronic kidney disease: Secondary | ICD-10-CM | POA: Diagnosis not present

## 2020-02-03 DIAGNOSIS — D869 Sarcoidosis, unspecified: Secondary | ICD-10-CM | POA: Diagnosis not present

## 2020-02-03 DIAGNOSIS — M1990 Unspecified osteoarthritis, unspecified site: Secondary | ICD-10-CM | POA: Diagnosis not present

## 2020-02-03 DIAGNOSIS — Z9981 Dependence on supplemental oxygen: Secondary | ICD-10-CM | POA: Diagnosis not present

## 2020-02-03 DIAGNOSIS — J449 Chronic obstructive pulmonary disease, unspecified: Secondary | ICD-10-CM | POA: Diagnosis not present

## 2020-02-03 DIAGNOSIS — E041 Nontoxic single thyroid nodule: Secondary | ICD-10-CM | POA: Diagnosis not present

## 2020-02-03 DIAGNOSIS — E0580 Other thyrotoxicosis without thyrotoxic crisis or storm: Secondary | ICD-10-CM | POA: Diagnosis not present

## 2020-02-03 DIAGNOSIS — K449 Diaphragmatic hernia without obstruction or gangrene: Secondary | ICD-10-CM | POA: Diagnosis not present

## 2020-02-06 ENCOUNTER — Encounter: Payer: Self-pay | Admitting: Pulmonary Disease

## 2020-02-06 ENCOUNTER — Other Ambulatory Visit: Payer: Self-pay

## 2020-02-06 ENCOUNTER — Ambulatory Visit (INDEPENDENT_AMBULATORY_CARE_PROVIDER_SITE_OTHER): Payer: Medicare Other | Admitting: Pulmonary Disease

## 2020-02-06 DIAGNOSIS — J9612 Chronic respiratory failure with hypercapnia: Secondary | ICD-10-CM

## 2020-02-06 DIAGNOSIS — J9611 Chronic respiratory failure with hypoxia: Secondary | ICD-10-CM | POA: Diagnosis not present

## 2020-02-06 NOTE — Patient Instructions (Signed)
Recheck saturation on 2 L pulse Okay to stay at this level At rest and increased to 3 L pulse  On walking  Check my DME if they can replace POC if continues to have low battery life  BiPAP is working well on current settings

## 2020-02-06 NOTE — Progress Notes (Addendum)
   Subjective:    Patient ID: Shelley Gonzalez, female    DOB: 12/13/1940, 79 y.o.   MRN: [id number]  HPI  79 yo never smoker for FU ofhypoxic and hypercarbic respiratory failure on BiPAP and oxygen Also has pericardial effusion noted 04/2017  She has severe restrictive lung disease due toleft hemidiaphragm paralysis/large hiatal hernia that seems to date back to 2005.Left lung is almost fully atelectatic  She had an episode of respiratory failure 04/2017  Chief Complaint  Patient presents with  . Follow-up    Patient has shortness of breath with exertion. Denies cough Patient wears 3 liters oxygen all the time.    She stopped using her BiPAP machine last year and was hospitalized for hypercarbic respiratory failure [place] in September.  Since then she has gotten back on her machine and has done really well Pedal edema is not significant, remains on Lasix.  Daughter accompanies, main complaint is that POC does not last as long.  On 3 L pulse she maintains 96% at rest  On 2L pulse 88-89%  No problems with BiPAP mask or pressure. Download was reviewed which shows excellent compliance more than 8 hours every night, no residual events and minimal leak   Significant tests/ events reviewed  ABG was 7.32/80 7/50 on 2 L oxygen Sniff test 06/26/17 consistent with left diaphragmatic paralysis. PFTs >> could not perform  CT chest 04/2017 was negative for pulmonary embolism, showed massive hiatal hernia on the left, small pleural effusion. Echo10/2018showed normal LV function with moderate pulmonary hypertension at 52 and moderate circumferential pericardial effusion without tamponade physiology  Echo 10/2017 >> mod effusion, RVSP 46   NPSG 06/2017- for obstructive sleep apnea,positive for nocturnal hypoxemia    Review of Systems Patient denies significant dyspnea,cough, hemoptysis,  chest pain, palpitations, pedal edema, orthopnea, paroxysmal nocturnal dyspnea,  lightheadedness, nausea, vomiting, abdominal or  leg pains      Objective:   Physical Exam  Gen. Pleasant, obese, in no distress ENT - no lesions, no post nasal drip Neck: No JVD, no thyromegaly, no carotid bruits Lungs: no use of accessory muscles, no dullness to percussion, decreased without rales or rhonchi  Cardiovascular: Rhythm regular, heart sounds  normal, no murmurs or gallops, no peripheral edema Musculoskeletal: No deformities, no cyanosis or clubbing , no tremors       Assessment & Plan:

## 2020-02-06 NOTE — Assessment & Plan Note (Addendum)
I have once again explained to her the cause of her hypercarbic respiratory failure is large diaphragmatic hernia and hemidiaphragmatic paralysis BiPAP has been very helpful to her and prevented hospitalization We have discussed surgery in the past , she has refused & at any rate , she is not a good candidate  She is very compliant with her BiPAP again and I encouraged her to do so. She willcontinue 3 L POC at rest &on ambulation request DME to get her new POC with longer battery life

## 2020-02-07 DIAGNOSIS — Z9981 Dependence on supplemental oxygen: Secondary | ICD-10-CM | POA: Diagnosis not present

## 2020-02-07 DIAGNOSIS — E0580 Other thyrotoxicosis without thyrotoxic crisis or storm: Secondary | ICD-10-CM | POA: Diagnosis not present

## 2020-02-07 DIAGNOSIS — H9190 Unspecified hearing loss, unspecified ear: Secondary | ICD-10-CM | POA: Diagnosis not present

## 2020-02-07 DIAGNOSIS — E1122 Type 2 diabetes mellitus with diabetic chronic kidney disease: Secondary | ICD-10-CM | POA: Diagnosis not present

## 2020-02-07 DIAGNOSIS — G2581 Restless legs syndrome: Secondary | ICD-10-CM | POA: Diagnosis not present

## 2020-02-07 DIAGNOSIS — M7502 Adhesive capsulitis of left shoulder: Secondary | ICD-10-CM | POA: Diagnosis not present

## 2020-02-07 DIAGNOSIS — N183 Chronic kidney disease, stage 3 unspecified: Secondary | ICD-10-CM | POA: Diagnosis not present

## 2020-02-07 DIAGNOSIS — K219 Gastro-esophageal reflux disease without esophagitis: Secondary | ICD-10-CM | POA: Diagnosis not present

## 2020-02-07 DIAGNOSIS — I129 Hypertensive chronic kidney disease with stage 1 through stage 4 chronic kidney disease, or unspecified chronic kidney disease: Secondary | ICD-10-CM | POA: Diagnosis not present

## 2020-02-07 DIAGNOSIS — K449 Diaphragmatic hernia without obstruction or gangrene: Secondary | ICD-10-CM | POA: Diagnosis not present

## 2020-02-07 DIAGNOSIS — E041 Nontoxic single thyroid nodule: Secondary | ICD-10-CM | POA: Diagnosis not present

## 2020-02-07 DIAGNOSIS — M1990 Unspecified osteoarthritis, unspecified site: Secondary | ICD-10-CM | POA: Diagnosis not present

## 2020-02-07 DIAGNOSIS — G4733 Obstructive sleep apnea (adult) (pediatric): Secondary | ICD-10-CM | POA: Diagnosis not present

## 2020-02-07 DIAGNOSIS — E782 Mixed hyperlipidemia: Secondary | ICD-10-CM | POA: Diagnosis not present

## 2020-02-07 DIAGNOSIS — D869 Sarcoidosis, unspecified: Secondary | ICD-10-CM | POA: Diagnosis not present

## 2020-02-07 DIAGNOSIS — J449 Chronic obstructive pulmonary disease, unspecified: Secondary | ICD-10-CM | POA: Diagnosis not present

## 2020-02-13 DIAGNOSIS — E782 Mixed hyperlipidemia: Secondary | ICD-10-CM | POA: Diagnosis not present

## 2020-02-13 DIAGNOSIS — H9190 Unspecified hearing loss, unspecified ear: Secondary | ICD-10-CM | POA: Diagnosis not present

## 2020-02-13 DIAGNOSIS — G4733 Obstructive sleep apnea (adult) (pediatric): Secondary | ICD-10-CM | POA: Diagnosis not present

## 2020-02-13 DIAGNOSIS — M7502 Adhesive capsulitis of left shoulder: Secondary | ICD-10-CM | POA: Diagnosis not present

## 2020-02-13 DIAGNOSIS — E0580 Other thyrotoxicosis without thyrotoxic crisis or storm: Secondary | ICD-10-CM | POA: Diagnosis not present

## 2020-02-13 DIAGNOSIS — J449 Chronic obstructive pulmonary disease, unspecified: Secondary | ICD-10-CM | POA: Diagnosis not present

## 2020-02-13 DIAGNOSIS — N183 Chronic kidney disease, stage 3 unspecified: Secondary | ICD-10-CM | POA: Diagnosis not present

## 2020-02-13 DIAGNOSIS — M1990 Unspecified osteoarthritis, unspecified site: Secondary | ICD-10-CM | POA: Diagnosis not present

## 2020-02-13 DIAGNOSIS — K219 Gastro-esophageal reflux disease without esophagitis: Secondary | ICD-10-CM | POA: Diagnosis not present

## 2020-02-13 DIAGNOSIS — E1122 Type 2 diabetes mellitus with diabetic chronic kidney disease: Secondary | ICD-10-CM | POA: Diagnosis not present

## 2020-02-13 DIAGNOSIS — G2581 Restless legs syndrome: Secondary | ICD-10-CM | POA: Diagnosis not present

## 2020-02-13 DIAGNOSIS — Z9981 Dependence on supplemental oxygen: Secondary | ICD-10-CM | POA: Diagnosis not present

## 2020-02-13 DIAGNOSIS — K449 Diaphragmatic hernia without obstruction or gangrene: Secondary | ICD-10-CM | POA: Diagnosis not present

## 2020-02-13 DIAGNOSIS — I129 Hypertensive chronic kidney disease with stage 1 through stage 4 chronic kidney disease, or unspecified chronic kidney disease: Secondary | ICD-10-CM | POA: Diagnosis not present

## 2020-02-13 DIAGNOSIS — E041 Nontoxic single thyroid nodule: Secondary | ICD-10-CM | POA: Diagnosis not present

## 2020-02-13 DIAGNOSIS — D869 Sarcoidosis, unspecified: Secondary | ICD-10-CM | POA: Diagnosis not present

## 2020-02-16 DIAGNOSIS — R062 Wheezing: Secondary | ICD-10-CM | POA: Diagnosis not present

## 2020-02-16 DIAGNOSIS — R269 Unspecified abnormalities of gait and mobility: Secondary | ICD-10-CM | POA: Diagnosis not present

## 2020-02-16 DIAGNOSIS — D860 Sarcoidosis of lung: Secondary | ICD-10-CM | POA: Diagnosis not present

## 2020-02-20 DIAGNOSIS — E1122 Type 2 diabetes mellitus with diabetic chronic kidney disease: Secondary | ICD-10-CM | POA: Diagnosis not present

## 2020-02-20 DIAGNOSIS — D869 Sarcoidosis, unspecified: Secondary | ICD-10-CM | POA: Diagnosis not present

## 2020-02-20 DIAGNOSIS — M1990 Unspecified osteoarthritis, unspecified site: Secondary | ICD-10-CM | POA: Diagnosis not present

## 2020-02-20 DIAGNOSIS — E041 Nontoxic single thyroid nodule: Secondary | ICD-10-CM | POA: Diagnosis not present

## 2020-02-20 DIAGNOSIS — H9190 Unspecified hearing loss, unspecified ear: Secondary | ICD-10-CM | POA: Diagnosis not present

## 2020-02-20 DIAGNOSIS — G2581 Restless legs syndrome: Secondary | ICD-10-CM | POA: Diagnosis not present

## 2020-02-20 DIAGNOSIS — N183 Chronic kidney disease, stage 3 unspecified: Secondary | ICD-10-CM | POA: Diagnosis not present

## 2020-02-20 DIAGNOSIS — K449 Diaphragmatic hernia without obstruction or gangrene: Secondary | ICD-10-CM | POA: Diagnosis not present

## 2020-02-20 DIAGNOSIS — M7502 Adhesive capsulitis of left shoulder: Secondary | ICD-10-CM | POA: Diagnosis not present

## 2020-02-20 DIAGNOSIS — I129 Hypertensive chronic kidney disease with stage 1 through stage 4 chronic kidney disease, or unspecified chronic kidney disease: Secondary | ICD-10-CM | POA: Diagnosis not present

## 2020-02-20 DIAGNOSIS — G4733 Obstructive sleep apnea (adult) (pediatric): Secondary | ICD-10-CM | POA: Diagnosis not present

## 2020-02-20 DIAGNOSIS — J449 Chronic obstructive pulmonary disease, unspecified: Secondary | ICD-10-CM | POA: Diagnosis not present

## 2020-02-20 DIAGNOSIS — K219 Gastro-esophageal reflux disease without esophagitis: Secondary | ICD-10-CM | POA: Diagnosis not present

## 2020-02-20 DIAGNOSIS — E782 Mixed hyperlipidemia: Secondary | ICD-10-CM | POA: Diagnosis not present

## 2020-02-20 DIAGNOSIS — Z9981 Dependence on supplemental oxygen: Secondary | ICD-10-CM | POA: Diagnosis not present

## 2020-02-20 DIAGNOSIS — E0580 Other thyrotoxicosis without thyrotoxic crisis or storm: Secondary | ICD-10-CM | POA: Diagnosis not present

## 2020-02-24 DIAGNOSIS — E7849 Other hyperlipidemia: Secondary | ICD-10-CM | POA: Diagnosis not present

## 2020-02-24 DIAGNOSIS — I129 Hypertensive chronic kidney disease with stage 1 through stage 4 chronic kidney disease, or unspecified chronic kidney disease: Secondary | ICD-10-CM | POA: Diagnosis not present

## 2020-02-24 DIAGNOSIS — N183 Chronic kidney disease, stage 3 unspecified: Secondary | ICD-10-CM | POA: Diagnosis not present

## 2020-02-24 DIAGNOSIS — E1122 Type 2 diabetes mellitus with diabetic chronic kidney disease: Secondary | ICD-10-CM | POA: Diagnosis not present

## 2020-02-27 DIAGNOSIS — N183 Chronic kidney disease, stage 3 unspecified: Secondary | ICD-10-CM | POA: Diagnosis not present

## 2020-02-27 DIAGNOSIS — J449 Chronic obstructive pulmonary disease, unspecified: Secondary | ICD-10-CM | POA: Diagnosis not present

## 2020-02-27 DIAGNOSIS — K449 Diaphragmatic hernia without obstruction or gangrene: Secondary | ICD-10-CM | POA: Diagnosis not present

## 2020-02-27 DIAGNOSIS — E041 Nontoxic single thyroid nodule: Secondary | ICD-10-CM | POA: Diagnosis not present

## 2020-02-27 DIAGNOSIS — E1122 Type 2 diabetes mellitus with diabetic chronic kidney disease: Secondary | ICD-10-CM | POA: Diagnosis not present

## 2020-02-27 DIAGNOSIS — M1990 Unspecified osteoarthritis, unspecified site: Secondary | ICD-10-CM | POA: Diagnosis not present

## 2020-02-27 DIAGNOSIS — M7502 Adhesive capsulitis of left shoulder: Secondary | ICD-10-CM | POA: Diagnosis not present

## 2020-02-27 DIAGNOSIS — K219 Gastro-esophageal reflux disease without esophagitis: Secondary | ICD-10-CM | POA: Diagnosis not present

## 2020-02-27 DIAGNOSIS — E782 Mixed hyperlipidemia: Secondary | ICD-10-CM | POA: Diagnosis not present

## 2020-02-27 DIAGNOSIS — G4733 Obstructive sleep apnea (adult) (pediatric): Secondary | ICD-10-CM | POA: Diagnosis not present

## 2020-02-27 DIAGNOSIS — H9190 Unspecified hearing loss, unspecified ear: Secondary | ICD-10-CM | POA: Diagnosis not present

## 2020-02-27 DIAGNOSIS — E0580 Other thyrotoxicosis without thyrotoxic crisis or storm: Secondary | ICD-10-CM | POA: Diagnosis not present

## 2020-02-27 DIAGNOSIS — G2581 Restless legs syndrome: Secondary | ICD-10-CM | POA: Diagnosis not present

## 2020-02-27 DIAGNOSIS — D869 Sarcoidosis, unspecified: Secondary | ICD-10-CM | POA: Diagnosis not present

## 2020-02-27 DIAGNOSIS — Z9981 Dependence on supplemental oxygen: Secondary | ICD-10-CM | POA: Diagnosis not present

## 2020-02-27 DIAGNOSIS — I129 Hypertensive chronic kidney disease with stage 1 through stage 4 chronic kidney disease, or unspecified chronic kidney disease: Secondary | ICD-10-CM | POA: Diagnosis not present

## 2020-03-07 DIAGNOSIS — D869 Sarcoidosis, unspecified: Secondary | ICD-10-CM | POA: Diagnosis not present

## 2020-03-07 DIAGNOSIS — G4733 Obstructive sleep apnea (adult) (pediatric): Secondary | ICD-10-CM | POA: Diagnosis not present

## 2020-03-07 DIAGNOSIS — K219 Gastro-esophageal reflux disease without esophagitis: Secondary | ICD-10-CM | POA: Diagnosis not present

## 2020-03-07 DIAGNOSIS — N183 Chronic kidney disease, stage 3 unspecified: Secondary | ICD-10-CM | POA: Diagnosis not present

## 2020-03-07 DIAGNOSIS — E782 Mixed hyperlipidemia: Secondary | ICD-10-CM | POA: Diagnosis not present

## 2020-03-07 DIAGNOSIS — I129 Hypertensive chronic kidney disease with stage 1 through stage 4 chronic kidney disease, or unspecified chronic kidney disease: Secondary | ICD-10-CM | POA: Diagnosis not present

## 2020-03-07 DIAGNOSIS — Z9981 Dependence on supplemental oxygen: Secondary | ICD-10-CM | POA: Diagnosis not present

## 2020-03-07 DIAGNOSIS — G2581 Restless legs syndrome: Secondary | ICD-10-CM | POA: Diagnosis not present

## 2020-03-07 DIAGNOSIS — J449 Chronic obstructive pulmonary disease, unspecified: Secondary | ICD-10-CM | POA: Diagnosis not present

## 2020-03-07 DIAGNOSIS — M7502 Adhesive capsulitis of left shoulder: Secondary | ICD-10-CM | POA: Diagnosis not present

## 2020-03-07 DIAGNOSIS — K449 Diaphragmatic hernia without obstruction or gangrene: Secondary | ICD-10-CM | POA: Diagnosis not present

## 2020-03-07 DIAGNOSIS — E0580 Other thyrotoxicosis without thyrotoxic crisis or storm: Secondary | ICD-10-CM | POA: Diagnosis not present

## 2020-03-07 DIAGNOSIS — M1990 Unspecified osteoarthritis, unspecified site: Secondary | ICD-10-CM | POA: Diagnosis not present

## 2020-03-07 DIAGNOSIS — H9190 Unspecified hearing loss, unspecified ear: Secondary | ICD-10-CM | POA: Diagnosis not present

## 2020-03-07 DIAGNOSIS — E1122 Type 2 diabetes mellitus with diabetic chronic kidney disease: Secondary | ICD-10-CM | POA: Diagnosis not present

## 2020-03-07 DIAGNOSIS — E041 Nontoxic single thyroid nodule: Secondary | ICD-10-CM | POA: Diagnosis not present

## 2020-03-12 DIAGNOSIS — E1122 Type 2 diabetes mellitus with diabetic chronic kidney disease: Secondary | ICD-10-CM | POA: Diagnosis not present

## 2020-03-12 DIAGNOSIS — E0580 Other thyrotoxicosis without thyrotoxic crisis or storm: Secondary | ICD-10-CM | POA: Diagnosis not present

## 2020-03-12 DIAGNOSIS — G4733 Obstructive sleep apnea (adult) (pediatric): Secondary | ICD-10-CM | POA: Diagnosis not present

## 2020-03-12 DIAGNOSIS — Z9981 Dependence on supplemental oxygen: Secondary | ICD-10-CM | POA: Diagnosis not present

## 2020-03-12 DIAGNOSIS — G2581 Restless legs syndrome: Secondary | ICD-10-CM | POA: Diagnosis not present

## 2020-03-12 DIAGNOSIS — D869 Sarcoidosis, unspecified: Secondary | ICD-10-CM | POA: Diagnosis not present

## 2020-03-12 DIAGNOSIS — N183 Chronic kidney disease, stage 3 unspecified: Secondary | ICD-10-CM | POA: Diagnosis not present

## 2020-03-12 DIAGNOSIS — E041 Nontoxic single thyroid nodule: Secondary | ICD-10-CM | POA: Diagnosis not present

## 2020-03-12 DIAGNOSIS — J449 Chronic obstructive pulmonary disease, unspecified: Secondary | ICD-10-CM | POA: Diagnosis not present

## 2020-03-12 DIAGNOSIS — M7502 Adhesive capsulitis of left shoulder: Secondary | ICD-10-CM | POA: Diagnosis not present

## 2020-03-12 DIAGNOSIS — E782 Mixed hyperlipidemia: Secondary | ICD-10-CM | POA: Diagnosis not present

## 2020-03-12 DIAGNOSIS — K219 Gastro-esophageal reflux disease without esophagitis: Secondary | ICD-10-CM | POA: Diagnosis not present

## 2020-03-12 DIAGNOSIS — H9190 Unspecified hearing loss, unspecified ear: Secondary | ICD-10-CM | POA: Diagnosis not present

## 2020-03-12 DIAGNOSIS — I129 Hypertensive chronic kidney disease with stage 1 through stage 4 chronic kidney disease, or unspecified chronic kidney disease: Secondary | ICD-10-CM | POA: Diagnosis not present

## 2020-03-12 DIAGNOSIS — K449 Diaphragmatic hernia without obstruction or gangrene: Secondary | ICD-10-CM | POA: Diagnosis not present

## 2020-03-12 DIAGNOSIS — M1990 Unspecified osteoarthritis, unspecified site: Secondary | ICD-10-CM | POA: Diagnosis not present

## 2020-03-13 ENCOUNTER — Ambulatory Visit: Payer: Medicare Other | Admitting: Family Medicine

## 2020-03-13 NOTE — Progress Notes (Deleted)
Cardiology Office Note  Date: 03/13/2020   ID: Shelley V Jemmott, DOB 01-05-1941, MRN [id number]  PCP:  Selinda Flavin, MD  Cardiologist:  Prentice Docker, MD (Inactive) Electrophysiologist:  None   Chief Complaint: Shortness of breath  History of Present Illness: Shelley Gonzalez is a 79 y.o. female with a history of pericardial effusion, sarcoidosis, bilateral LE edema, hypoxic/hypercarbic respiratory failure on O2 at home continuous with nocturnal BiPAP, severe restrictive lung disease secondary to left hemidiaphragmatic paralysis and large hiatal hernia, DM 2, HTN  Echocardiogram 05/12/2018 showed vigorous LV systolic function with EF 65-70 and a small pericardial effusion.   Last encounter with Dr. Purvis Sheffield 11/21/2019.  She had been having worsening shortness of breath from baseline exertional dyspnea.  She had not seen her pulmonologist since 2019.  Had mild bilateral leg swelling.  Denied chest pain or palpitations.  Given her shortness of breath and pulmonary hypertension with history of pulmonary sarcoidosis with chronic hypoxic, hypercarbic respiratory failure she was referred to pulmonology and an echocardiogram was ordered.  She did not want surgery for her large hiatal hernia.  She saw Dr. Vassie Loll pulmonology who had once again explained to her the cause of her hypercarbic respiratory failure was a large diaphragmatic hernia and hemodiaphragmatic paralysis.  She was very compliant with her BiPAP and was continuing her 3 L oxygen at rest and with ambulation.  Past Medical History:  Diagnosis Date  . GERD (gastroesophageal reflux disease)   . Hiatal hernia   . Hyperlipidemia   . Hypertension   . Sarcoidosis     Past Surgical History:  Procedure Laterality Date  . CYST REMOVAL TRUNK     back  . PARTIAL HYSTERECTOMY      Current Outpatient Medications  Medication Sig Dispense Refill  . furosemide (LASIX) 40 MG tablet Take 20 mg by mouth every morning.   2  . valsartan  (DIOVAN) 320 MG tablet Take 320 mg by mouth every morning.     No current facility-administered medications for this visit.   Allergies:  Patient has no known allergies.   Social History: The patient  reports that she has never smoked. She has never used smokeless tobacco. She reports previous alcohol use. She reports previous drug use.   Family History: The patient's family history includes Heart attack in her brother; Heart disease in her mother.   ROS:  Please see the history of present illness. Otherwise, complete review of systems is positive for {NONE DEFAULTED:18576::"none"}.  All other systems are reviewed and negative.   Physical Exam: VS:  There were no vitals taken for this visit., BMI There is no height or weight on file to calculate BMI.  Wt Readings from Last 3 Encounters:  02/06/20 230 lb (104.3 kg)  11/29/19 226 lb (102.5 kg)  11/21/19 226 lb (102.5 kg)    General: Patient appears comfortable at rest. HEENT: Conjunctiva and lids normal, oropharynx clear with moist mucosa. Neck: Supple, no elevated JVP or carotid bruits, no thyromegaly. Lungs: Clear to auscultation, nonlabored breathing at rest. Cardiac: Regular rate and rhythm, no S3 or significant systolic murmur, no pericardial rub. Abdomen: Soft, nontender, no hepatomegaly, bowel sounds present, no guarding or rebound. Extremities: No pitting edema, distal pulses 2+. Skin: Warm and dry. Musculoskeletal: No kyphosis. Neuropsychiatric: Alert and oriented x3, affect grossly appropriate.  ECG:  {EKG/Telemetry Strips Reviewed:[id number]}  Recent Labwork: No results found for requested labs within last 8760 hours.  No results found for: CHOL, TRIG, HDL, CHOLHDL, VLDL,  LDLCALC, LDLDIRECT  Other Studies Reviewed Today:  Echocardiogram 12/21/2019  1. Left ventricular ejection fraction, by estimation, is 70 to 75%. The left ventricle has hyperdynamic function. The left ventricle has no regional wall motion  abnormalities. There is mild concentric left ventricular hypertrophy. 2. Right ventricular systolic function is normal. The right ventricular size is normal. 3. The mitral valve is grossly normal. 4. The inferior vena cava is normal in size with greater than 50% respiratory variability, suggesting right atrial pressure of 3 mmHg. 5. A very small pericardial effusion is present. The pericardial effusion is circumferential. There is no evidence of cardiac tamponade. Comparison(s): Echocardiogram done 05/12/18 showed an EF of 70% with a small pericardial effusion without tamponade  Assessment and Plan:  1. Pericardial effusion   2. Essential hypertension      Medication Adjustments/Labs and Tests Ordered: Current medicines are reviewed at length with the patient today.  Concerns regarding medicines are outlined above.   Disposition: Follow-up with ***  Signed, Rennis Harding, NP 03/13/2020 5:46 AM    [facility] [address] [place], [place], [place] [address] Phone: [phone]; Fax: [phone]

## 2020-03-18 DIAGNOSIS — R062 Wheezing: Secondary | ICD-10-CM | POA: Diagnosis not present

## 2020-03-18 DIAGNOSIS — R269 Unspecified abnormalities of gait and mobility: Secondary | ICD-10-CM | POA: Diagnosis not present

## 2020-03-18 DIAGNOSIS — D860 Sarcoidosis of lung: Secondary | ICD-10-CM | POA: Diagnosis not present

## 2020-03-22 NOTE — Progress Notes (Signed)
Cardiology Office Note  Date: 03/23/2020   ID: Shelley Gonzalez, DOB 1941-06-03, MRN [id number]  PCP:  Selinda Flavin, MD  Cardiologist:  Prentice Docker, MD (Inactive) Electrophysiologist:  None   Chief Complaint: Shortness of breath  History of Present Illness: Shelley Gonzalez is a 79 y.o. female with a history of pericardial effusion, sarcoidosis, bilateral LE edema, hypoxic/hypercarbic respiratory failure on O2 at home continuous with nocturnal BiPAP, severe restrictive lung disease secondary to left hemidiaphragmatic paralysis and large hiatal hernia, DM 2, HTN  Echocardiogram 05/12/2018 showed vigorous LV systolic function with EF 65-70 and a small pericardial effusion.   Last encounter with Dr. Purvis Sheffield 11/21/2019.  She had been having worsening shortness of breath from baseline exertional dyspnea.  She had not seen her pulmonologist since 2019.  Had mild bilateral leg swelling.  Denied chest pain or palpitations.  Given her shortness of breath and pulmonary hypertension with history of pulmonary sarcoidosis with chronic hypoxic, hypercarbic respiratory failure she was referred to pulmonology and an echocardiogram was ordered.  She did not want surgery for her large hiatal hernia.  She saw Dr. Vassie Loll pulmonology who had once again explained to her the cause of her hypercarbic respiratory failure was a large diaphragmatic hernia and hemidiaphragmatic paralysis.  She was very compliant with her BiPAP and was continuing her 3 L oxygen at rest and with ambulation.   She is here for 67-month follow-up today.  She denies any recent acute illnesses, hospitalizations.  Had recently seen Dr. Vassie Loll pulmonology for hypercarbic respiratory failure.  She continues on 3 L continuous nasal cannula O2.  Uses BiPAP at home.  She denies any significant anginal symptoms.  Obviously has chronic dyspnea but nothing out of the ordinary per her statement.  No orthostatic symptoms, palpitations or arrhythmias,  CVA or TIA-like symptoms, PND, orthopnea, claudication, DVT or PE-like symptoms, or lower extremity edema.  She states she takes her fluid pill as needed if she notices increasing lower extremity edema or increasing shortness of breath.  Blood pressure is well controlled today.  Past Medical History:  Diagnosis Date  . GERD (gastroesophageal reflux disease)   . Hiatal hernia   . Hyperlipidemia   . Hypertension   . Sarcoidosis     Past Surgical History:  Procedure Laterality Date  . CYST REMOVAL TRUNK     back  . PARTIAL HYSTERECTOMY      Current Outpatient Medications  Medication Sig Dispense Refill  . furosemide (LASIX) 40 MG tablet Take 20 mg by mouth every morning.   2  . valsartan (DIOVAN) 320 MG tablet Take 320 mg by mouth every morning.     No current facility-administered medications for this visit.   Allergies:  Patient has no known allergies.   Social History: The patient  reports that she has never smoked. She has never used smokeless tobacco. She reports previous alcohol use. She reports previous drug use.   Family History: The patient's family history includes Heart attack in her brother; Heart disease in her mother.   ROS:  Please see the history of present illness. Otherwise, complete review of systems is positive for none.  All other systems are reviewed and negative.   Physical Exam: VS:  BP 128/80   Pulse 89   Ht 5' 5.5" (1.664 m)   Wt 228 lb (103.4 kg)   SpO2 96% Comment: 3 L/min 24/7  BMI 37.36 kg/m , BMI Body mass index is 37.36 kg/m.  Wt Readings from Last  3 Encounters:  03/23/20 228 lb (103.4 kg)  02/06/20 230 lb (104.3 kg)  11/29/19 226 lb (102.5 kg)    General: Patient appears comfortable at rest. Neck: Supple, no elevated JVP or carotid bruits, no thyromegaly. Lungs: Clear to auscultation, nonlabored breathing at rest. Cardiac: Regular rate and rhythm, no S3 or significant systolic murmur, no pericardial rub. Extremities: No pitting edema,  distal pulses 2+. Skin: Warm and dry. Musculoskeletal: No kyphosis. Neuropsychiatric: Alert and oriented x3, affect grossly appropriate.  ECG:  EKG 11/21/2019 sinus rhythm with rate of 92 PVCs  Recent Labwork: No results found for requested labs within last 8760 hours.  No results found for: CHOL, TRIG, HDL, CHOLHDL, VLDL, LDLCALC, LDLDIRECT  Other Studies Reviewed Today:  Echocardiogram 12/21/2019  1. Left ventricular ejection fraction, by estimation, is 70 to 75%. The left ventricle has hyperdynamic function. The left ventricle has no regional wall motion abnormalities. There is mild concentric left ventricular hypertrophy. 2. Right ventricular systolic function is normal. The right ventricular size is normal. 3. The mitral valve is grossly normal. 4. The inferior vena cava is normal in size with greater than 50% respiratory variability, suggesting right atrial pressure of 3 mmHg. 5. A very small pericardial effusion is present. The pericardial effusion is circumferential. There is no evidence of cardiac tamponade. Comparison(s): Echocardiogram done 05/12/18 showed an EF of 70% with a small pericardial effusion without tamponade  Assessment and Plan:  1. Pericardial effusion   2. Essential hypertension   3. Bilateral lower extremity edema   4. Large hiatal hernia   5. SOB (shortness of breath)    1. Pericardial effusion Very small pericardial effusion present on last echo on 12/21/2019 which was circumferential  2. Essential hypertension Blood pressure well controlled on current medications.  Continue valsartan 320 mg daily.  3. Bilateral lower extremity edema History of bilateral lower extremity edema.  Patient has no lower extremity today.  Continue Lasix 40 mg daily.  4. Large hiatal hernia large diaphragmatic hernia and hemidiaphragmatic paralysis.  Patient has chosen not have surgical intervention.  5. SOB (shortness of breath) Multifactorial due to large  hemidiaphragmatic surgery with him at diaphragmatic paralysis.  Chronic hypoxic hypercarbic respiratory failure.,  Pulmonary hypertension, pulmonary sarcoidosis.  Remains on 3 L nasal cannula continuously.  Medication Adjustments/Labs and Tests Ordered: Current medicines are reviewed at length with the patient today.  Concerns regarding medicines are outlined above.   Disposition: Follow-up with follow-up 6 months with Dr. Wyline Mood.  Patient wants to establish  Signed, Rennis Harding, NP 03/23/2020 9:07 AM    [facility] at [facility] [address] [place], [place], [place] [address] Phone: [phone]; Fax: [phone]

## 2020-03-23 ENCOUNTER — Encounter: Payer: Self-pay | Admitting: Family Medicine

## 2020-03-23 ENCOUNTER — Ambulatory Visit: Payer: Medicare Other | Admitting: Family Medicine

## 2020-03-23 VITALS — BP 128/80 | HR 89 | Ht 65.5 in | Wt 228.0 lb

## 2020-03-23 DIAGNOSIS — K449 Diaphragmatic hernia without obstruction or gangrene: Secondary | ICD-10-CM

## 2020-03-23 DIAGNOSIS — R600 Localized edema: Secondary | ICD-10-CM | POA: Diagnosis not present

## 2020-03-23 DIAGNOSIS — I313 Pericardial effusion (noninflammatory): Secondary | ICD-10-CM

## 2020-03-23 DIAGNOSIS — I10 Essential (primary) hypertension: Secondary | ICD-10-CM

## 2020-03-23 DIAGNOSIS — I3139 Other pericardial effusion (noninflammatory): Secondary | ICD-10-CM

## 2020-03-23 DIAGNOSIS — R0602 Shortness of breath: Secondary | ICD-10-CM | POA: Diagnosis not present

## 2020-03-23 NOTE — Patient Instructions (Signed)
Medication Instructions:  Continue all current medications.   Labwork: none  Testing/Procedures: none  Follow-Up: 6 months   Any Other Special Instructions Will Be Listed Below (If Applicable).   If you need a refill on your cardiac medications before your next appointment, please call your pharmacy.  

## 2020-03-27 DIAGNOSIS — E1122 Type 2 diabetes mellitus with diabetic chronic kidney disease: Secondary | ICD-10-CM | POA: Diagnosis not present

## 2020-03-27 DIAGNOSIS — I129 Hypertensive chronic kidney disease with stage 1 through stage 4 chronic kidney disease, or unspecified chronic kidney disease: Secondary | ICD-10-CM | POA: Diagnosis not present

## 2020-03-27 DIAGNOSIS — E7849 Other hyperlipidemia: Secondary | ICD-10-CM | POA: Diagnosis not present

## 2020-03-27 DIAGNOSIS — N183 Chronic kidney disease, stage 3 unspecified: Secondary | ICD-10-CM | POA: Diagnosis not present

## 2020-04-18 DIAGNOSIS — R269 Unspecified abnormalities of gait and mobility: Secondary | ICD-10-CM | POA: Diagnosis not present

## 2020-04-18 DIAGNOSIS — R062 Wheezing: Secondary | ICD-10-CM | POA: Diagnosis not present

## 2020-04-18 DIAGNOSIS — D860 Sarcoidosis of lung: Secondary | ICD-10-CM | POA: Diagnosis not present

## 2020-04-26 DIAGNOSIS — I129 Hypertensive chronic kidney disease with stage 1 through stage 4 chronic kidney disease, or unspecified chronic kidney disease: Secondary | ICD-10-CM | POA: Diagnosis not present

## 2020-04-26 DIAGNOSIS — N183 Chronic kidney disease, stage 3 unspecified: Secondary | ICD-10-CM | POA: Diagnosis not present

## 2020-04-26 DIAGNOSIS — E7849 Other hyperlipidemia: Secondary | ICD-10-CM | POA: Diagnosis not present

## 2020-04-26 DIAGNOSIS — E1122 Type 2 diabetes mellitus with diabetic chronic kidney disease: Secondary | ICD-10-CM | POA: Diagnosis not present

## 2020-05-18 DIAGNOSIS — D860 Sarcoidosis of lung: Secondary | ICD-10-CM | POA: Diagnosis not present

## 2020-05-18 DIAGNOSIS — R062 Wheezing: Secondary | ICD-10-CM | POA: Diagnosis not present

## 2020-05-18 DIAGNOSIS — R269 Unspecified abnormalities of gait and mobility: Secondary | ICD-10-CM | POA: Diagnosis not present

## 2020-05-29 ENCOUNTER — Ambulatory Visit: Payer: Medicare Other | Admitting: Cardiovascular Disease

## 2020-06-18 DIAGNOSIS — D860 Sarcoidosis of lung: Secondary | ICD-10-CM | POA: Diagnosis not present

## 2020-06-18 DIAGNOSIS — R269 Unspecified abnormalities of gait and mobility: Secondary | ICD-10-CM | POA: Diagnosis not present

## 2020-06-18 DIAGNOSIS — R062 Wheezing: Secondary | ICD-10-CM | POA: Diagnosis not present

## 2020-07-18 DIAGNOSIS — D860 Sarcoidosis of lung: Secondary | ICD-10-CM | POA: Diagnosis not present

## 2020-07-18 DIAGNOSIS — R269 Unspecified abnormalities of gait and mobility: Secondary | ICD-10-CM | POA: Diagnosis not present

## 2020-07-18 DIAGNOSIS — R062 Wheezing: Secondary | ICD-10-CM | POA: Diagnosis not present

## 2020-07-27 DIAGNOSIS — E7849 Other hyperlipidemia: Secondary | ICD-10-CM | POA: Diagnosis not present

## 2020-07-27 DIAGNOSIS — N183 Chronic kidney disease, stage 3 unspecified: Secondary | ICD-10-CM | POA: Diagnosis not present

## 2020-07-27 DIAGNOSIS — I129 Hypertensive chronic kidney disease with stage 1 through stage 4 chronic kidney disease, or unspecified chronic kidney disease: Secondary | ICD-10-CM | POA: Diagnosis not present

## 2020-07-27 DIAGNOSIS — E1122 Type 2 diabetes mellitus with diabetic chronic kidney disease: Secondary | ICD-10-CM | POA: Diagnosis not present

## 2020-08-18 DIAGNOSIS — R062 Wheezing: Secondary | ICD-10-CM | POA: Diagnosis not present

## 2020-08-18 DIAGNOSIS — D860 Sarcoidosis of lung: Secondary | ICD-10-CM | POA: Diagnosis not present

## 2020-08-18 DIAGNOSIS — R269 Unspecified abnormalities of gait and mobility: Secondary | ICD-10-CM | POA: Diagnosis not present

## 2020-09-18 ENCOUNTER — Ambulatory Visit (INDEPENDENT_AMBULATORY_CARE_PROVIDER_SITE_OTHER): Payer: Medicare Other | Admitting: Cardiology

## 2020-09-18 ENCOUNTER — Encounter: Payer: Self-pay | Admitting: Cardiology

## 2020-09-18 VITALS — BP 182/84 | HR 91 | Ht 65.5 in | Wt 228.0 lb

## 2020-09-18 DIAGNOSIS — R600 Localized edema: Secondary | ICD-10-CM

## 2020-09-18 DIAGNOSIS — I10 Essential (primary) hypertension: Secondary | ICD-10-CM | POA: Diagnosis not present

## 2020-09-18 DIAGNOSIS — I3139 Other pericardial effusion (noninflammatory): Secondary | ICD-10-CM

## 2020-09-18 DIAGNOSIS — I313 Pericardial effusion (noninflammatory): Secondary | ICD-10-CM | POA: Diagnosis not present

## 2020-09-18 NOTE — Progress Notes (Signed)
Clinical Summary Shelley Gonzalez is a 80 y.o.female former patient of Dr Purvis Sheffield, this is our first visit together.  1. Pericardial effusion 11/2019 echo LVEF 70-75%, very small effusion   2. Chronic respiratory failure - from pulm notes large diaphragmatic hernia and hemidiaphragm paryalysis - on home O2 3L - followed by pulmonary - chronic stable SOB  3. Sarcoidosis  4. OSA - on bipap at night  5. HTN - compliant with meds - does not check at home    6. LE edema - no recent edema - limiting sodium intake - compliant with diuretic    Has had covid vaccine x 3 Past Medical History:  Diagnosis Date  . GERD (gastroesophageal reflux disease)   . Hiatal hernia   . Hyperlipidemia   . Hypertension   . Sarcoidosis      No Known Allergies   Current Outpatient Medications  Medication Sig Dispense Refill  . furosemide (LASIX) 40 MG tablet Take 20 mg by mouth every morning.   2  . valsartan (DIOVAN) 320 MG tablet Take 320 mg by mouth every morning.     No current facility-administered medications for this visit.     Past Surgical History:  Procedure Laterality Date  . CYST REMOVAL TRUNK     back  . PARTIAL HYSTERECTOMY       No Known Allergies    Family History  Problem Relation Age of Onset  . Heart disease Mother   . Heart attack Brother      Social History Ms. Tapp reports that she has never smoked. She has never used smokeless tobacco. Ms. Hoston reports previous alcohol use.   Review of Systems CONSTITUTIONAL: No weight loss, fever, chills, weakness or fatigue.  HEENT: Eyes: No visual loss, blurred vision, double vision or yellow sclerae.No hearing loss, sneezing, congestion, runny nose or sore throat.  SKIN: No rash or itching.  CARDIOVASCULAR: per hpi RESPIRATORY: per hpi GASTROINTESTINAL: No anorexia, nausea, vomiting or diarrhea. No abdominal pain or blood.  GENITOURINARY: No burning on urination, no polyuria NEUROLOGICAL: No  headache, dizziness, syncope, paralysis, ataxia, numbness or tingling in the extremities. No change in bowel or bladder control.  MUSCULOSKELETAL: No muscle, back pain, joint pain or stiffness.  LYMPHATICS: No enlarged nodes. No history of splenectomy.  PSYCHIATRIC: No history of depression or anxiety.  ENDOCRINOLOGIC: No reports of sweating, cold or heat intolerance. No polyuria or polydipsia.  Marland Kitchen   Physical Examination Today's Vitals   09/18/20 1029  BP: (!) 182/84  Pulse: 91  SpO2: 92%  Weight: 228 lb (103.4 kg)  Height: 5' 5.5" (1.664 m)   Body mass index is 37.36 kg/m.  Gen: resting comfortably, no acute distress HEENT: no scleral icterus, pupils equal round and reactive, no palptable cervical adenopathy,  CV: RRR, no m/r/g, no jvd Resp: Clear to auscultation bilaterally GI: abdomen is soft, non-tender, non-distended, normal bowel sounds, no hepatosplenomegaly MSK: extremities are warm, no edema.  Skin: warm, no rash Neuro:  no focal deficits Psych: appropriate affect   Diagnostic Studies 11/2019 echo 1. Left ventricular ejection fraction, by estimation, is 70 to 75%. The  left ventricle has hyperdynamic function. The left ventricle has no  regional wall motion abnormalities. There is mild concentric left  ventricular hypertrophy.  2. Right ventricular systolic function is normal. The right ventricular  size is normal.  3. The mitral valve is grossly normal.  4. The inferior vena cava is normal in size with greater than 50%  respiratory variability, suggesting right atrial pressure of 3 mmHg.  5. A very small pericardial effusion is present. The pericardial effusion  is circumferential. There is no evidence of cardiac tamponade.   Comparison(s): Echocardiogram done 05/12/18 showed an EF of 70% with a  small pericardial effusion without tamponade.   04/2018 echo Study Conclusions   - Left ventricle: The cavity size was normal. Systolic function was   vigorous. The estimated ejection fraction was in the range of 65%  to 70%. Wall motion was normal; there were no regional wall  motion abnormalities.  - Pericardium, extracardiac: Small pericardial effusion without  tamponade physiology.    05/2017 echo Study Conclusions   - Left ventricle: The cavity size was normal. Wall thickness was  increased in a pattern of mild LVH. Systolic function was  vigorous. The estimated ejection fraction was in the range of 65%  to 70%. Wall motion was normal; there were no regional wall  motion abnormalities. Doppler parameters are consistent with  abnormal left ventricular relaxation (grade 1 diastolic  dysfunction).  - Mitral valve: There was trivial regurgitation.  - Right atrium: Central venous pressure (est): 3 mm Hg.  - Atrial septum: No defect or patent foramen ovale was identified.  - Tricuspid valve: There was trivial regurgitation.  - Pulmonary arteries: Systolic pressure could not be accurately  estimated.  - Pericardium, extracardiac: Moderate anterior and posterior  pericardial effusion.    Assessment and Plan   1. Pericardial effusion - trivial by most recent echo, continue to monitor  2. LE edema - controlled, continue diuretic - recheck labs including renal function and eletrolytes  3. HTN - manual recheck 140/78, just took meds prior to coming. At last visit was at goal - continue to monitor at this time. - will see if can get her a home bp cuff   F/u 6 months  Antoine Poche, M.D.

## 2020-09-18 NOTE — Addendum Note (Signed)
Addended by: Burman Nieves T on: 09/18/2020 10:57 AM   Modules accepted: Orders

## 2020-09-18 NOTE — Patient Instructions (Signed)
Your physician wants you to follow-up in: 6 MONTHS WITH DR [facility]  Your physician recommends that you continue on your current medications as directed. Please refer to the Current Medication list given to you today.  Your physician recommends that you return for lab work CBC/BMP/LIPIDS/TSH/HGBA1C/MG - PLEASE FAST 6-8 HOURS PRIOR TO LAB WORK   Thank you for choosing [facility]!!

## 2021-04-10 ENCOUNTER — Ambulatory Visit: Payer: Medicare Other | Admitting: Cardiology

## 2021-04-10 NOTE — Progress Notes (Deleted)
Clinical Summary Shelley Gonzalez is a 80 y.o.female   1. Pericardial effusion 11/2019 echo LVEF 70-75%, very small effusion     2. Chronic respiratory failure - from pulm notes large diaphragmatic hernia and hemidiaphragm paryalysis - on home O2 3L - followed by pulmonary - chronic stable SOB   3. Sarcoidosis   4. OSA - on bipap at night   5. HTN - compliant with meds - does not check at home       6. LE edema - no recent edema - limiting sodium intake - compliant with diuretic   Past Medical History:  Diagnosis Date   GERD (gastroesophageal reflux disease)    Hiatal hernia    Hyperlipidemia    Hypertension    Sarcoidosis      No Known Allergies   Current Outpatient Medications  Medication Sig Dispense Refill   furosemide (LASIX) 40 MG tablet Take 20 mg by mouth every morning.   2   NIFEdipine (PROCARDIA XL/NIFEDICAL-XL) 90 MG 24 hr tablet Take 90 mg by mouth daily.     valsartan-hydrochlorothiazide (DIOVAN-HCT) 320-25 MG tablet Take 1 tablet by mouth daily.     No current facility-administered medications for this visit.     Past Surgical History:  Procedure Laterality Date   CYST REMOVAL TRUNK     back   PARTIAL HYSTERECTOMY       No Known Allergies    Family History  Problem Relation Age of Onset   Heart disease Mother    Heart attack Brother      Social History Shelley Gonzalez reports that she has never smoked. She has never used smokeless tobacco. Shelley Gonzalez reports that she does not currently use alcohol.   Review of Systems CONSTITUTIONAL: No weight loss, fever, chills, weakness or fatigue.  HEENT: Eyes: No visual loss, blurred vision, double vision or yellow sclerae.No hearing loss, sneezing, congestion, runny nose or sore throat.  SKIN: No rash or itching.  CARDIOVASCULAR:  RESPIRATORY: No shortness of breath, cough or sputum.  GASTROINTESTINAL: No anorexia, nausea, vomiting or diarrhea. No abdominal pain or blood.   GENITOURINARY: No burning on urination, no polyuria NEUROLOGICAL: No headache, dizziness, syncope, paralysis, ataxia, numbness or tingling in the extremities. No change in bowel or bladder control.  MUSCULOSKELETAL: No muscle, back pain, joint pain or stiffness.  LYMPHATICS: No enlarged nodes. No history of splenectomy.  PSYCHIATRIC: No history of depression or anxiety.  ENDOCRINOLOGIC: No reports of sweating, cold or heat intolerance. No polyuria or polydipsia.  Marland Kitchen   Physical Examination There were no vitals filed for this visit. There were no vitals filed for this visit.  Gen: resting comfortably, no acute distress HEENT: no scleral icterus, pupils equal round and reactive, no palptable cervical adenopathy,  CV Resp: Clear to auscultation bilaterally GI: abdomen is soft, non-tender, non-distended, normal bowel sounds, no hepatosplenomegaly MSK: extremities are warm, no edema.  Skin: warm, no rash Neuro:  no focal deficits Psych: appropriate affect   Diagnostic Studies 11/2019 echo  1. Left ventricular ejection fraction, by estimation, is 70 to 75%. The  left ventricle has hyperdynamic function. The left ventricle has no  regional wall motion abnormalities. There is mild concentric left  ventricular hypertrophy.   2. Right ventricular systolic function is normal. The right ventricular  size is normal.   3. The mitral valve is grossly normal.   4. The inferior vena cava is normal in size with greater than 50%  respiratory variability, suggesting right  atrial pressure of 3 mmHg.   5. A very small pericardial effusion is present. The pericardial effusion  is circumferential. There is no evidence of cardiac tamponade.   Comparison(s): Echocardiogram done 05/12/18 showed an EF of 70% with a  small pericardial effusion without tamponade.    04/2018 echo Study Conclusions   - Left ventricle: The cavity size was normal. Systolic function was    vigorous. The estimated ejection  fraction was in the range of 65%    to 70%. Wall motion was normal; there were no regional wall    motion abnormalities.  - Pericardium, extracardiac: Small pericardial effusion without    tamponade physiology.      05/2017 echo Study Conclusions   - Left ventricle: The cavity size was normal. Wall thickness was    increased in a pattern of mild LVH. Systolic function was    vigorous. The estimated ejection fraction was in the range of 65%    to 70%. Wall motion was normal; there were no regional wall    motion abnormalities. Doppler parameters are consistent with    abnormal left ventricular relaxation (grade 1 diastolic    dysfunction).  - Mitral valve: There was trivial regurgitation.  - Right atrium: Central venous pressure (est): 3 mm Hg.  - Atrial septum: No defect or patent foramen ovale was identified.  - Tricuspid valve: There was trivial regurgitation.  - Pulmonary arteries: Systolic pressure could not be accurately    estimated.  - Pericardium, extracardiac: Moderate anterior and posterior    pericardial effusion.       Assessment and Plan   Important   Pulse 91 Ht 5' 5.5" (1.664 m) Wt 228 lb (103.4 kg) SpO2 92% BMI 37.36 kg/m BSA 2.19 m  More Vitals   Flowsheets:  Anthropometrics, NEWS, MEWS Score   Encounter Info:  Billing Info, History, Allergies, Detailed Report    All Notes   Addendum Note by Burman Nieves T, CMA at 09/18/2020 10:20 AM  Author: Albertine Patricia, CMA Author Type: Certified Medical Assistant Filed: 09/18/2020 10:57 AM  Note Status: Signed Cosign: Cosign Not Required Encounter Date: 09/18/2020  Editor: Albertine Patricia, CMA (Certified Medical Assistant)             Addended by: Burman Nieves T on: 09/18/2020 10:57 AM     Modules accepted: Orders        Patient Instructions by Albertine Patricia, CMA at 09/18/2020 10:20 AM  Author: Albertine Patricia, CMA Author Type: Certified Medical Assistant Filed: 09/18/2020 10:57 AM   Note Status: Signed Cosign: Cosign Not Required Encounter Date: 09/18/2020  Editor: Albertine Patricia, CMA (Certified Medical Assistant)             Your physician wants you to follow-up in: 6 MONTHS WITH DR [facility]  Your physician recommends that you continue on your current medications as directed. Please refer to the Current Medication list given to you today.   Your physician recommends that you return for lab work CBC/BMP/LIPIDS/TSH/HGBA1C/MG - PLEASE FAST 6-8 HOURS PRIOR TO LAB WORK    Thank you for choosing [facility]!!             Progress Notes by Antoine Poche, MD at 09/18/2020 10:20 AM  Author: Antoine Poche, MD Author Type: Physician Filed: 09/18/2020 10:53 AM  Note Status: Signed Cosign: Cosign Not Required Encounter Date: 09/18/2020  Editor: Antoine Poche, MD (Physician)  Clinical Summary Shelley Gonzalez is a 80 y.o.female former patient of Dr Purvis Sheffield, this is our first visit together.   1. Pericardial effusion 11/2019 echo LVEF 70-75%, very small effusion     2. Chronic respiratory failure - from pulm notes large diaphragmatic hernia and hemidiaphragm paryalysis - on home O2 3L - followed by pulmonary - chronic stable SOB   3. Sarcoidosis   4. OSA - on bipap at night   5. HTN - compliant with meds - does not check at home       6. LE edema - no recent edema - limiting sodium intake - compliant with diuretic       Has had covid vaccine x 3     Past Medical History:  Diagnosis Date   GERD (gastroesophageal reflux disease)     Hiatal hernia     Hyperlipidemia     Hypertension     Sarcoidosis          No Known Allergies           Current Outpatient Medications  Medication Sig Dispense Refill   furosemide (LASIX) 40 MG tablet Take 20 mg by mouth every morning.    2   valsartan (DIOVAN) 320 MG tablet Take 320 mg by mouth every morning.        No current facility-administered medications for  this visit.             Past Surgical History:  Procedure Laterality Date   CYST REMOVAL TRUNK        back   PARTIAL HYSTERECTOMY            No Known Allergies            Family History  Problem Relation Age of Onset   Heart disease Mother     Heart attack Brother          Social History Shelley Gonzalez reports that she has never smoked. She has never used smokeless tobacco. Shelley Gonzalez reports previous alcohol use.     Review of Systems CONSTITUTIONAL: No weight loss, fever, chills, weakness or fatigue.  HEENT: Eyes: No visual loss, blurred vision, double vision or yellow sclerae.No hearing loss, sneezing, congestion, runny nose or sore throat.  SKIN: No rash or itching.  CARDIOVASCULAR: per hpi RESPIRATORY: per hpi GASTROINTESTINAL: No anorexia, nausea, vomiting or diarrhea. No abdominal pain or blood.  GENITOURINARY: No burning on urination, no polyuria NEUROLOGICAL: No headache, dizziness, syncope, paralysis, ataxia, numbness or tingling in the extremities. No change in bowel or bladder control.  MUSCULOSKELETAL: No muscle, back pain, joint pain or stiffness.  LYMPHATICS: No enlarged nodes. No history of splenectomy.  PSYCHIATRIC: No history of depression or anxiety.  ENDOCRINOLOGIC: No reports of sweating, cold or heat intolerance. No polyuria or polydipsia.  Marland Kitchen     Physical Examination    Today's Vitals    09/18/20 1029  BP: (!) 182/84  Pulse: 91  SpO2: 92%  Weight: 228 lb (103.4 kg)  Height: 5' 5.5" (1.664 m)    Body mass index is 37.36 kg/m.   Gen: resting comfortably, no acute distress HEENT: no scleral icterus, pupils equal round and reactive, no palptable cervical adenopathy,  CV: RRR, no m/r/g, no jvd Resp: Clear to auscultation bilaterally GI: abdomen is soft, non-tender, non-distended, normal bowel sounds, no hepatosplenomegaly MSK: extremities are warm, no edema.  Skin: warm, no rash Neuro:  no focal deficits Psych: appropriate affect      Diagnostic Studies 11/2019  echo  1. Left ventricular ejection fraction, by estimation, is 70 to 75%. The  left ventricle has hyperdynamic function. The left ventricle has no  regional wall motion abnormalities. There is mild concentric left  ventricular hypertrophy.   2. Right ventricular systolic function is normal. The right ventricular  size is normal.   3. The mitral valve is grossly normal.   4. The inferior vena cava is normal in size with greater than 50%  respiratory variability, suggesting right atrial pressure of 3 mmHg.   5. A very small pericardial effusion is present. The pericardial effusion  is circumferential. There is no evidence of cardiac tamponade.   Comparison(s): Echocardiogram done 05/12/18 showed an EF of 70% with a  small pericardial effusion without tamponade.    04/2018 echo Study Conclusions   - Left ventricle: The cavity size was normal. Systolic function was    vigorous. The estimated ejection fraction was in the range of 65%    to 70%. Wall motion was normal; there were no regional wall    motion abnormalities.  - Pericardium, extracardiac: Small pericardial effusion without    tamponade physiology.      05/2017 echo Study Conclusions   - Left ventricle: The cavity size was normal. Wall thickness was    increased in a pattern of mild LVH. Systolic function was    vigorous. The estimated ejection fraction was in the range of 65%    to 70%. Wall motion was normal; there were no regional wall    motion abnormalities. Doppler parameters are consistent with    abnormal left ventricular relaxation (grade 1 diastolic    dysfunction).  - Mitral valve: There was trivial regurgitation.  - Right atrium: Central venous pressure (est): 3 mm Hg.  - Atrial septum: No defect or patent foramen ovale was identified.  - Tricuspid valve: There was trivial regurgitation.  - Pulmonary arteries: Systolic pressure could not be accurately    estimated.  -  Pericardium, extracardiac: Moderate anterior and posterior    pericardial effusion.      Assessment and Plan    1. Pericardial effusion - trivial by most recent echo, continue to monitor   2. LE edema - controlled, continue diuretic - recheck labs including renal function and eletrolytes   3. HTN - manual recheck 140/78, just took meds prior to coming. At last visit was at goal - continue to monitor at this time. - will see if can get her a home bp cuff           Antoine Poche, M.D., F.A.C.C.

## 2021-04-11 ENCOUNTER — Encounter: Payer: Self-pay | Admitting: Cardiology

## 2021-04-28 DIAGNOSIS — G4733 Obstructive sleep apnea (adult) (pediatric): Secondary | ICD-10-CM | POA: Insufficient documentation

## 2021-04-28 DIAGNOSIS — D869 Sarcoidosis, unspecified: Secondary | ICD-10-CM | POA: Insufficient documentation

## 2021-04-28 DIAGNOSIS — D72829 Elevated white blood cell count, unspecified: Secondary | ICD-10-CM

## 2021-04-28 DIAGNOSIS — E119 Type 2 diabetes mellitus without complications: Secondary | ICD-10-CM

## 2021-04-28 DIAGNOSIS — R7989 Other specified abnormal findings of blood chemistry: Secondary | ICD-10-CM

## 2021-04-28 DIAGNOSIS — I10 Essential (primary) hypertension: Secondary | ICD-10-CM

## 2021-04-28 DIAGNOSIS — Z91199 Patient's noncompliance with other medical treatment and regimen due to unspecified reason: Secondary | ICD-10-CM | POA: Insufficient documentation

## 2021-04-28 DIAGNOSIS — J441 Chronic obstructive pulmonary disease with (acute) exacerbation: Secondary | ICD-10-CM

## 2021-04-28 DIAGNOSIS — I509 Heart failure, unspecified: Secondary | ICD-10-CM

## 2021-04-28 DIAGNOSIS — J9621 Acute and chronic respiratory failure with hypoxia: Secondary | ICD-10-CM | POA: Insufficient documentation

## 2021-04-28 DIAGNOSIS — J986 Disorders of diaphragm: Secondary | ICD-10-CM | POA: Insufficient documentation

## 2021-04-28 HISTORY — DX: Type 2 diabetes mellitus without complications: E11.9

## 2021-04-28 HISTORY — DX: Essential (primary) hypertension: I10

## 2021-04-28 HISTORY — DX: Elevated white blood cell count, unspecified: D72.829

## 2021-04-28 HISTORY — DX: Heart failure, unspecified: I50.9

## 2021-04-28 HISTORY — DX: Acute and chronic respiratory failure with hypercapnia: J96.21

## 2021-04-28 HISTORY — DX: Obstructive sleep apnea (adult) (pediatric): G47.33

## 2021-04-28 HISTORY — DX: Other specified abnormal findings of blood chemistry: R79.89

## 2021-04-28 HISTORY — DX: Chronic obstructive pulmonary disease with (acute) exacerbation: J44.1

## 2021-04-28 HISTORY — DX: Disorders of diaphragm: J98.6

## 2021-04-28 HISTORY — DX: Patient's noncompliance with other medical treatment and regimen due to unspecified reason: Z91.199

## 2021-05-24 DIAGNOSIS — B338 Other specified viral diseases: Secondary | ICD-10-CM | POA: Insufficient documentation

## 2021-05-24 HISTORY — DX: Other specified viral diseases: B33.8

## 2021-05-27 DIAGNOSIS — R0902 Hypoxemia: Secondary | ICD-10-CM | POA: Insufficient documentation

## 2021-05-27 HISTORY — DX: Hypoxemia: R09.02

## 2021-07-09 ENCOUNTER — Ambulatory Visit: Payer: Medicare Other | Admitting: Pulmonary Disease

## 2021-10-16 DIAGNOSIS — R062 Wheezing: Secondary | ICD-10-CM | POA: Diagnosis not present

## 2021-10-16 DIAGNOSIS — D860 Sarcoidosis of lung: Secondary | ICD-10-CM | POA: Diagnosis not present

## 2021-10-16 DIAGNOSIS — R269 Unspecified abnormalities of gait and mobility: Secondary | ICD-10-CM | POA: Diagnosis not present

## 2021-10-21 ENCOUNTER — Encounter: Payer: Self-pay | Admitting: Pulmonary Disease

## 2021-10-21 ENCOUNTER — Ambulatory Visit (INDEPENDENT_AMBULATORY_CARE_PROVIDER_SITE_OTHER): Payer: Self-pay | Admitting: Pulmonary Disease

## 2021-10-21 ENCOUNTER — Other Ambulatory Visit: Payer: Self-pay

## 2021-10-21 VITALS — BP 128/78 | HR 84 | Temp 98.2°F | Ht 65.0 in | Wt 214.0 lb

## 2021-10-21 DIAGNOSIS — G4733 Obstructive sleep apnea (adult) (pediatric): Secondary | ICD-10-CM

## 2021-10-21 DIAGNOSIS — J9611 Chronic respiratory failure with hypoxia: Secondary | ICD-10-CM

## 2021-10-21 DIAGNOSIS — I2729 Other secondary pulmonary hypertension: Secondary | ICD-10-CM

## 2021-10-21 DIAGNOSIS — J9612 Chronic respiratory failure with hypercapnia: Secondary | ICD-10-CM

## 2021-10-21 NOTE — Assessment & Plan Note (Signed)
Continue oxygen during the daytime. ?Furosemide as needed ?

## 2021-10-21 NOTE — Progress Notes (Signed)
? ?  Subjective:  ? ? Patient ID: Shelley Gonzalez, female    DOB: 1940-10-29, 81 y.o.   MRN: [id number] ? ?HPI ? ?81 yo never smoker  for FU of chronic hypoxic and hypercarbic respiratory failure on BiPAP and 3L oxygen  ?  ?She has severe restrictive lung disease due to left hemidiaphragm paralysis/large hiatal hernia that seems to date back to 2005.  Left lung is almost fully atelectatic ? ?PMH - pericardial effusion noted 04/2017  ?- episodes of respiratory failure 04/2017, 03/2020 ? ? ?Last seen 01/2020 , she has been lost to follow-up since. ?Arrives accompanied by her daughter today who corroborates history ?She reports numbness in her toes due to a nerve problem. ?She was hospitalized 03/2021 for hypotension, found to have an RSV infection and hypoxia. ?She admits to not using her BiPAP machine every night especially the last 2 weeks, no clear reason, she states that she has a lot of slough and daughter is worried about aspiration ?She also reports nasal drainage in the daytime.  She is compliant with oxygen, using POC today ? ?Significant tests/ events reviewed ? ?ABG was 7.32/80 7/50 on 2 L oxygen ?Sniff test 06/26/17 consistent with left diaphragmatic paralysis.  ?PFTs >> could not perform ?  ?CT chest 04/2017 was negative for pulmonary embolism, showed massive hiatal hernia on the left, small pleural effusion. ?Echo 04/2017 showed normal LV function with moderate pulmonary hypertension at 52 and moderate circumferential pericardial effusion without tamponade physiology ?  ?Echo 10/2017 >> mod effusion, RVSP 46 ?  ?  ?NPSG 12/ 2018- for obstructive sleep apnea, positive for nocturnal hypoxemia ? ?Review of Systems ?neg for any significant sore throat, dysphagia, itching, sneezing, nasal congestion or excess/ purulent secretions, fever, chills, sweats, unintended wt loss, pleuritic or exertional cp, hempoptysis, orthopnea pnd or change in chronic leg swelling. Also denies presyncope, palpitations, heartburn,  abdominal pain, nausea, vomiting, diarrhea or change in bowel or urinary habits, dysuria,hematuria, rash, arthralgias, visual complaints, headache, numbness weakness or ataxia. ? ?   ?Objective:  ? Physical Exam ? ?Gen. Pleasant, obese, in no distress ?ENT - no lesions, no post nasal drip ?Neck: No JVD, no thyromegaly, no carotid bruits ?Lungs: no use of accessory muscles, no dullness to percussion, decreased on left without rales or rhonchi  ?Cardiovascular: Rhythm regular, heart sounds  normal, no murmurs or gallops, no peripheral edema ?Musculoskeletal: No deformities, no cyanosis or clubbing , no tremors ? ? ? ?   ?Assessment & Plan:  ? ? ?

## 2021-10-21 NOTE — Assessment & Plan Note (Signed)
We again discussed that the cause of her hypoventilation is left diaphragm weakness and diaphragmatic hernia.  She is not a great surgical candidate and is not willing to take the risk of surgery.  We discussed this again with her daughter present. ?As such she has done reasonably well with BiPAP therapy, emphasized compliance again.  BiPAP compliance was reviewed which shows use of only 14 out of 30 days with more than 6 hours on nights used on BiPAP settings 12/6, she has a fullface mask ?She will try a chinstrap and lower the setting of a humidifier. ?She will use Flonase in the daytime for nasal drainage ? ?Weight loss encouraged, compliance with goal of at least 4-6 hrs every night is the expectation. ?Advised against medications with sedative side effects ?Cautioned against driving when sleepy - understanding that sleepiness will vary on a day to day basis ? ?

## 2021-10-21 NOTE — Patient Instructions (Signed)
?  Try to use your Bipap machine every night ? ?X chin strap ? ?Decrease humidity on CPAP ?X Humidifier for oxygen at home ? ? ?

## 2021-10-28 DIAGNOSIS — G4733 Obstructive sleep apnea (adult) (pediatric): Secondary | ICD-10-CM | POA: Diagnosis not present

## 2021-10-28 DIAGNOSIS — J9610 Chronic respiratory failure, unspecified whether with hypoxia or hypercapnia: Secondary | ICD-10-CM | POA: Diagnosis not present

## 2021-11-06 DIAGNOSIS — R059 Cough, unspecified: Secondary | ICD-10-CM | POA: Diagnosis not present

## 2021-11-06 DIAGNOSIS — J984 Other disorders of lung: Secondary | ICD-10-CM | POA: Diagnosis not present

## 2021-11-06 DIAGNOSIS — Z6836 Body mass index (BMI) 36.0-36.9, adult: Secondary | ICD-10-CM | POA: Diagnosis not present

## 2021-11-06 DIAGNOSIS — I10 Essential (primary) hypertension: Secondary | ICD-10-CM | POA: Diagnosis not present

## 2021-11-12 DIAGNOSIS — R269 Unspecified abnormalities of gait and mobility: Secondary | ICD-10-CM | POA: Diagnosis not present

## 2021-11-13 DIAGNOSIS — J449 Chronic obstructive pulmonary disease, unspecified: Secondary | ICD-10-CM | POA: Diagnosis not present

## 2021-11-13 DIAGNOSIS — J9611 Chronic respiratory failure with hypoxia: Secondary | ICD-10-CM | POA: Diagnosis not present

## 2021-11-13 DIAGNOSIS — J9612 Chronic respiratory failure with hypercapnia: Secondary | ICD-10-CM | POA: Diagnosis not present

## 2021-11-13 DIAGNOSIS — Z515 Encounter for palliative care: Secondary | ICD-10-CM | POA: Diagnosis not present

## 2021-11-16 DIAGNOSIS — R269 Unspecified abnormalities of gait and mobility: Secondary | ICD-10-CM | POA: Diagnosis not present

## 2021-11-16 DIAGNOSIS — R062 Wheezing: Secondary | ICD-10-CM | POA: Diagnosis not present

## 2021-11-16 DIAGNOSIS — D860 Sarcoidosis of lung: Secondary | ICD-10-CM | POA: Diagnosis not present

## 2021-12-16 DIAGNOSIS — D860 Sarcoidosis of lung: Secondary | ICD-10-CM | POA: Diagnosis not present

## 2021-12-16 DIAGNOSIS — R269 Unspecified abnormalities of gait and mobility: Secondary | ICD-10-CM | POA: Diagnosis not present

## 2021-12-16 DIAGNOSIS — R062 Wheezing: Secondary | ICD-10-CM | POA: Diagnosis not present

## 2021-12-17 DIAGNOSIS — J449 Chronic obstructive pulmonary disease, unspecified: Secondary | ICD-10-CM | POA: Diagnosis not present

## 2021-12-17 DIAGNOSIS — J9611 Chronic respiratory failure with hypoxia: Secondary | ICD-10-CM | POA: Diagnosis not present

## 2021-12-17 DIAGNOSIS — Z515 Encounter for palliative care: Secondary | ICD-10-CM | POA: Diagnosis not present

## 2021-12-17 DIAGNOSIS — J9612 Chronic respiratory failure with hypercapnia: Secondary | ICD-10-CM | POA: Diagnosis not present

## 2022-01-15 DIAGNOSIS — J9611 Chronic respiratory failure with hypoxia: Secondary | ICD-10-CM | POA: Diagnosis not present

## 2022-01-15 DIAGNOSIS — J449 Chronic obstructive pulmonary disease, unspecified: Secondary | ICD-10-CM | POA: Diagnosis not present

## 2022-01-15 DIAGNOSIS — J9612 Chronic respiratory failure with hypercapnia: Secondary | ICD-10-CM | POA: Diagnosis not present

## 2022-01-15 DIAGNOSIS — Z515 Encounter for palliative care: Secondary | ICD-10-CM | POA: Diagnosis not present

## 2022-01-16 DIAGNOSIS — R062 Wheezing: Secondary | ICD-10-CM | POA: Diagnosis not present

## 2022-01-16 DIAGNOSIS — D860 Sarcoidosis of lung: Secondary | ICD-10-CM | POA: Diagnosis not present

## 2022-01-16 DIAGNOSIS — R269 Unspecified abnormalities of gait and mobility: Secondary | ICD-10-CM | POA: Diagnosis not present

## 2022-02-14 DIAGNOSIS — J9612 Chronic respiratory failure with hypercapnia: Secondary | ICD-10-CM | POA: Diagnosis not present

## 2022-02-14 DIAGNOSIS — Z515 Encounter for palliative care: Secondary | ICD-10-CM | POA: Diagnosis not present

## 2022-02-14 DIAGNOSIS — J449 Chronic obstructive pulmonary disease, unspecified: Secondary | ICD-10-CM | POA: Diagnosis not present

## 2022-02-14 DIAGNOSIS — J9611 Chronic respiratory failure with hypoxia: Secondary | ICD-10-CM | POA: Diagnosis not present

## 2022-02-15 DIAGNOSIS — G4733 Obstructive sleep apnea (adult) (pediatric): Secondary | ICD-10-CM | POA: Diagnosis not present

## 2022-02-15 DIAGNOSIS — J9610 Chronic respiratory failure, unspecified whether with hypoxia or hypercapnia: Secondary | ICD-10-CM | POA: Diagnosis not present

## 2022-03-13 DIAGNOSIS — J449 Chronic obstructive pulmonary disease, unspecified: Secondary | ICD-10-CM | POA: Diagnosis not present

## 2022-03-13 DIAGNOSIS — N189 Chronic kidney disease, unspecified: Secondary | ICD-10-CM | POA: Diagnosis not present

## 2022-03-13 DIAGNOSIS — I10 Essential (primary) hypertension: Secondary | ICD-10-CM | POA: Diagnosis not present

## 2022-03-13 DIAGNOSIS — G629 Polyneuropathy, unspecified: Secondary | ICD-10-CM | POA: Diagnosis not present

## 2022-03-13 DIAGNOSIS — Z6839 Body mass index (BMI) 39.0-39.9, adult: Secondary | ICD-10-CM | POA: Diagnosis not present

## 2022-03-14 DIAGNOSIS — G4733 Obstructive sleep apnea (adult) (pediatric): Secondary | ICD-10-CM | POA: Diagnosis not present

## 2022-03-14 DIAGNOSIS — J9610 Chronic respiratory failure, unspecified whether with hypoxia or hypercapnia: Secondary | ICD-10-CM | POA: Diagnosis not present

## 2022-03-18 DIAGNOSIS — G4733 Obstructive sleep apnea (adult) (pediatric): Secondary | ICD-10-CM | POA: Diagnosis not present

## 2022-03-18 DIAGNOSIS — J9610 Chronic respiratory failure, unspecified whether with hypoxia or hypercapnia: Secondary | ICD-10-CM | POA: Diagnosis not present

## 2022-03-27 DIAGNOSIS — Z515 Encounter for palliative care: Secondary | ICD-10-CM | POA: Diagnosis not present

## 2022-03-27 DIAGNOSIS — J9612 Chronic respiratory failure with hypercapnia: Secondary | ICD-10-CM | POA: Diagnosis not present

## 2022-03-27 DIAGNOSIS — J9611 Chronic respiratory failure with hypoxia: Secondary | ICD-10-CM | POA: Diagnosis not present

## 2022-03-27 DIAGNOSIS — J449 Chronic obstructive pulmonary disease, unspecified: Secondary | ICD-10-CM | POA: Diagnosis not present

## 2022-04-18 ENCOUNTER — Ambulatory Visit: Payer: Medicare HMO | Admitting: Pulmonary Disease

## 2022-04-18 DIAGNOSIS — J9610 Chronic respiratory failure, unspecified whether with hypoxia or hypercapnia: Secondary | ICD-10-CM | POA: Diagnosis not present

## 2022-04-18 DIAGNOSIS — G4733 Obstructive sleep apnea (adult) (pediatric): Secondary | ICD-10-CM | POA: Diagnosis not present

## 2022-05-01 DIAGNOSIS — Z515 Encounter for palliative care: Secondary | ICD-10-CM | POA: Diagnosis not present

## 2022-05-01 DIAGNOSIS — J9612 Chronic respiratory failure with hypercapnia: Secondary | ICD-10-CM | POA: Diagnosis not present

## 2022-05-01 DIAGNOSIS — J9611 Chronic respiratory failure with hypoxia: Secondary | ICD-10-CM | POA: Diagnosis not present

## 2022-05-01 DIAGNOSIS — J449 Chronic obstructive pulmonary disease, unspecified: Secondary | ICD-10-CM | POA: Diagnosis not present

## 2022-05-18 DIAGNOSIS — J9610 Chronic respiratory failure, unspecified whether with hypoxia or hypercapnia: Secondary | ICD-10-CM | POA: Diagnosis not present

## 2022-05-18 DIAGNOSIS — G4733 Obstructive sleep apnea (adult) (pediatric): Secondary | ICD-10-CM | POA: Diagnosis not present

## 2022-06-02 DIAGNOSIS — Z8709 Personal history of other diseases of the respiratory system: Secondary | ICD-10-CM | POA: Diagnosis not present

## 2022-06-02 DIAGNOSIS — A419 Sepsis, unspecified organism: Secondary | ICD-10-CM | POA: Diagnosis not present

## 2022-06-02 DIAGNOSIS — J9621 Acute and chronic respiratory failure with hypoxia: Secondary | ICD-10-CM | POA: Diagnosis not present

## 2022-06-02 DIAGNOSIS — D72829 Elevated white blood cell count, unspecified: Secondary | ICD-10-CM | POA: Diagnosis not present

## 2022-06-02 DIAGNOSIS — J9692 Respiratory failure, unspecified with hypercapnia: Secondary | ICD-10-CM | POA: Diagnosis not present

## 2022-06-02 DIAGNOSIS — J440 Chronic obstructive pulmonary disease with acute lower respiratory infection: Secondary | ICD-10-CM | POA: Diagnosis not present

## 2022-06-02 DIAGNOSIS — Z9989 Dependence on other enabling machines and devices: Secondary | ICD-10-CM | POA: Diagnosis not present

## 2022-06-02 DIAGNOSIS — Z7951 Long term (current) use of inhaled steroids: Secondary | ICD-10-CM | POA: Diagnosis not present

## 2022-06-02 DIAGNOSIS — R7989 Other specified abnormal findings of blood chemistry: Secondary | ICD-10-CM | POA: Insufficient documentation

## 2022-06-02 DIAGNOSIS — Z792 Long term (current) use of antibiotics: Secondary | ICD-10-CM | POA: Diagnosis not present

## 2022-06-02 DIAGNOSIS — J9622 Acute and chronic respiratory failure with hypercapnia: Secondary | ICD-10-CM | POA: Diagnosis not present

## 2022-06-02 DIAGNOSIS — G9341 Metabolic encephalopathy: Secondary | ICD-10-CM | POA: Diagnosis not present

## 2022-06-02 DIAGNOSIS — Z9981 Dependence on supplemental oxygen: Secondary | ICD-10-CM | POA: Diagnosis not present

## 2022-06-02 DIAGNOSIS — G839 Paralytic syndrome, unspecified: Secondary | ICD-10-CM | POA: Diagnosis not present

## 2022-06-02 DIAGNOSIS — J9601 Acute respiratory failure with hypoxia: Secondary | ICD-10-CM

## 2022-06-02 DIAGNOSIS — Z20822 Contact with and (suspected) exposure to covid-19: Secondary | ICD-10-CM | POA: Diagnosis not present

## 2022-06-02 DIAGNOSIS — D869 Sarcoidosis, unspecified: Secondary | ICD-10-CM | POA: Diagnosis not present

## 2022-06-02 DIAGNOSIS — J9811 Atelectasis: Secondary | ICD-10-CM | POA: Diagnosis not present

## 2022-06-02 DIAGNOSIS — Z8719 Personal history of other diseases of the digestive system: Secondary | ICD-10-CM | POA: Diagnosis not present

## 2022-06-02 DIAGNOSIS — R0602 Shortness of breath: Secondary | ICD-10-CM | POA: Diagnosis not present

## 2022-06-02 DIAGNOSIS — J189 Pneumonia, unspecified organism: Secondary | ICD-10-CM | POA: Diagnosis not present

## 2022-06-02 DIAGNOSIS — G4733 Obstructive sleep apnea (adult) (pediatric): Secondary | ICD-10-CM | POA: Diagnosis not present

## 2022-06-02 DIAGNOSIS — I10 Essential (primary) hypertension: Secondary | ICD-10-CM | POA: Diagnosis not present

## 2022-06-02 DIAGNOSIS — Z91010 Allergy to peanuts: Secondary | ICD-10-CM | POA: Diagnosis not present

## 2022-06-02 DIAGNOSIS — E119 Type 2 diabetes mellitus without complications: Secondary | ICD-10-CM | POA: Diagnosis not present

## 2022-06-02 DIAGNOSIS — R4182 Altered mental status, unspecified: Secondary | ICD-10-CM | POA: Diagnosis not present

## 2022-06-02 DIAGNOSIS — J441 Chronic obstructive pulmonary disease with (acute) exacerbation: Secondary | ICD-10-CM | POA: Diagnosis not present

## 2022-06-02 DIAGNOSIS — J9602 Acute respiratory failure with hypercapnia: Secondary | ICD-10-CM | POA: Diagnosis not present

## 2022-06-02 DIAGNOSIS — R410 Disorientation, unspecified: Secondary | ICD-10-CM | POA: Diagnosis not present

## 2022-06-02 DIAGNOSIS — J986 Disorders of diaphragm: Secondary | ICD-10-CM | POA: Diagnosis not present

## 2022-06-02 HISTORY — DX: Other specified abnormal findings of blood chemistry: R79.89

## 2022-06-02 HISTORY — DX: Pneumonia, unspecified organism: J18.9

## 2022-06-02 HISTORY — DX: Acute respiratory failure with hypoxia: J96.01

## 2022-06-06 DIAGNOSIS — J9610 Chronic respiratory failure, unspecified whether with hypoxia or hypercapnia: Secondary | ICD-10-CM | POA: Diagnosis not present

## 2022-06-06 DIAGNOSIS — J449 Chronic obstructive pulmonary disease, unspecified: Secondary | ICD-10-CM | POA: Diagnosis not present

## 2022-06-07 DIAGNOSIS — Z9981 Dependence on supplemental oxygen: Secondary | ICD-10-CM | POA: Diagnosis not present

## 2022-06-07 DIAGNOSIS — I110 Hypertensive heart disease with heart failure: Secondary | ICD-10-CM | POA: Diagnosis not present

## 2022-06-07 DIAGNOSIS — J189 Pneumonia, unspecified organism: Secondary | ICD-10-CM | POA: Diagnosis not present

## 2022-06-07 DIAGNOSIS — J441 Chronic obstructive pulmonary disease with (acute) exacerbation: Secondary | ICD-10-CM | POA: Diagnosis not present

## 2022-06-07 DIAGNOSIS — J9622 Acute and chronic respiratory failure with hypercapnia: Secondary | ICD-10-CM | POA: Diagnosis not present

## 2022-06-07 DIAGNOSIS — I509 Heart failure, unspecified: Secondary | ICD-10-CM | POA: Diagnosis not present

## 2022-06-07 DIAGNOSIS — E119 Type 2 diabetes mellitus without complications: Secondary | ICD-10-CM | POA: Diagnosis not present

## 2022-06-07 DIAGNOSIS — J9621 Acute and chronic respiratory failure with hypoxia: Secondary | ICD-10-CM | POA: Diagnosis not present

## 2022-06-07 DIAGNOSIS — B338 Other specified viral diseases: Secondary | ICD-10-CM | POA: Diagnosis not present

## 2022-06-07 DIAGNOSIS — J440 Chronic obstructive pulmonary disease with acute lower respiratory infection: Secondary | ICD-10-CM | POA: Diagnosis not present

## 2022-06-11 DIAGNOSIS — I509 Heart failure, unspecified: Secondary | ICD-10-CM | POA: Diagnosis not present

## 2022-06-11 DIAGNOSIS — J9621 Acute and chronic respiratory failure with hypoxia: Secondary | ICD-10-CM | POA: Diagnosis not present

## 2022-06-11 DIAGNOSIS — I110 Hypertensive heart disease with heart failure: Secondary | ICD-10-CM | POA: Diagnosis not present

## 2022-06-11 DIAGNOSIS — J189 Pneumonia, unspecified organism: Secondary | ICD-10-CM | POA: Diagnosis not present

## 2022-06-11 DIAGNOSIS — J440 Chronic obstructive pulmonary disease with acute lower respiratory infection: Secondary | ICD-10-CM | POA: Diagnosis not present

## 2022-06-11 DIAGNOSIS — J9622 Acute and chronic respiratory failure with hypercapnia: Secondary | ICD-10-CM | POA: Diagnosis not present

## 2022-06-11 DIAGNOSIS — E119 Type 2 diabetes mellitus without complications: Secondary | ICD-10-CM | POA: Diagnosis not present

## 2022-06-11 DIAGNOSIS — Z9981 Dependence on supplemental oxygen: Secondary | ICD-10-CM | POA: Diagnosis not present

## 2022-06-11 DIAGNOSIS — B338 Other specified viral diseases: Secondary | ICD-10-CM | POA: Diagnosis not present

## 2022-06-11 DIAGNOSIS — J441 Chronic obstructive pulmonary disease with (acute) exacerbation: Secondary | ICD-10-CM | POA: Diagnosis not present

## 2022-06-13 DIAGNOSIS — J9622 Acute and chronic respiratory failure with hypercapnia: Secondary | ICD-10-CM | POA: Diagnosis not present

## 2022-06-13 DIAGNOSIS — J440 Chronic obstructive pulmonary disease with acute lower respiratory infection: Secondary | ICD-10-CM | POA: Diagnosis not present

## 2022-06-13 DIAGNOSIS — J9621 Acute and chronic respiratory failure with hypoxia: Secondary | ICD-10-CM | POA: Diagnosis not present

## 2022-06-13 DIAGNOSIS — E119 Type 2 diabetes mellitus without complications: Secondary | ICD-10-CM | POA: Diagnosis not present

## 2022-06-13 DIAGNOSIS — J189 Pneumonia, unspecified organism: Secondary | ICD-10-CM | POA: Diagnosis not present

## 2022-06-13 DIAGNOSIS — I509 Heart failure, unspecified: Secondary | ICD-10-CM | POA: Diagnosis not present

## 2022-06-13 DIAGNOSIS — B338 Other specified viral diseases: Secondary | ICD-10-CM | POA: Diagnosis not present

## 2022-06-13 DIAGNOSIS — J441 Chronic obstructive pulmonary disease with (acute) exacerbation: Secondary | ICD-10-CM | POA: Diagnosis not present

## 2022-06-13 DIAGNOSIS — Z9981 Dependence on supplemental oxygen: Secondary | ICD-10-CM | POA: Diagnosis not present

## 2022-06-13 DIAGNOSIS — I110 Hypertensive heart disease with heart failure: Secondary | ICD-10-CM | POA: Diagnosis not present

## 2022-06-16 DIAGNOSIS — J9621 Acute and chronic respiratory failure with hypoxia: Secondary | ICD-10-CM | POA: Diagnosis not present

## 2022-06-16 DIAGNOSIS — I509 Heart failure, unspecified: Secondary | ICD-10-CM | POA: Diagnosis not present

## 2022-06-16 DIAGNOSIS — Z9981 Dependence on supplemental oxygen: Secondary | ICD-10-CM | POA: Diagnosis not present

## 2022-06-16 DIAGNOSIS — J441 Chronic obstructive pulmonary disease with (acute) exacerbation: Secondary | ICD-10-CM | POA: Diagnosis not present

## 2022-06-16 DIAGNOSIS — B338 Other specified viral diseases: Secondary | ICD-10-CM | POA: Diagnosis not present

## 2022-06-16 DIAGNOSIS — J9622 Acute and chronic respiratory failure with hypercapnia: Secondary | ICD-10-CM | POA: Diagnosis not present

## 2022-06-16 DIAGNOSIS — I110 Hypertensive heart disease with heart failure: Secondary | ICD-10-CM | POA: Diagnosis not present

## 2022-06-16 DIAGNOSIS — J189 Pneumonia, unspecified organism: Secondary | ICD-10-CM | POA: Diagnosis not present

## 2022-06-16 DIAGNOSIS — E119 Type 2 diabetes mellitus without complications: Secondary | ICD-10-CM | POA: Diagnosis not present

## 2022-06-16 DIAGNOSIS — J440 Chronic obstructive pulmonary disease with acute lower respiratory infection: Secondary | ICD-10-CM | POA: Diagnosis not present

## 2022-06-18 DIAGNOSIS — J9610 Chronic respiratory failure, unspecified whether with hypoxia or hypercapnia: Secondary | ICD-10-CM | POA: Diagnosis not present

## 2022-06-18 DIAGNOSIS — G4733 Obstructive sleep apnea (adult) (pediatric): Secondary | ICD-10-CM | POA: Diagnosis not present

## 2022-06-24 DIAGNOSIS — I110 Hypertensive heart disease with heart failure: Secondary | ICD-10-CM | POA: Diagnosis not present

## 2022-06-24 DIAGNOSIS — J189 Pneumonia, unspecified organism: Secondary | ICD-10-CM | POA: Diagnosis not present

## 2022-06-24 DIAGNOSIS — J441 Chronic obstructive pulmonary disease with (acute) exacerbation: Secondary | ICD-10-CM | POA: Diagnosis not present

## 2022-06-24 DIAGNOSIS — J9622 Acute and chronic respiratory failure with hypercapnia: Secondary | ICD-10-CM | POA: Diagnosis not present

## 2022-06-24 DIAGNOSIS — J440 Chronic obstructive pulmonary disease with acute lower respiratory infection: Secondary | ICD-10-CM | POA: Diagnosis not present

## 2022-06-24 DIAGNOSIS — B338 Other specified viral diseases: Secondary | ICD-10-CM | POA: Diagnosis not present

## 2022-06-24 DIAGNOSIS — E119 Type 2 diabetes mellitus without complications: Secondary | ICD-10-CM | POA: Diagnosis not present

## 2022-06-24 DIAGNOSIS — I509 Heart failure, unspecified: Secondary | ICD-10-CM | POA: Diagnosis not present

## 2022-06-24 DIAGNOSIS — J9621 Acute and chronic respiratory failure with hypoxia: Secondary | ICD-10-CM | POA: Diagnosis not present

## 2022-06-24 DIAGNOSIS — Z9981 Dependence on supplemental oxygen: Secondary | ICD-10-CM | POA: Diagnosis not present

## 2022-06-26 DIAGNOSIS — I509 Heart failure, unspecified: Secondary | ICD-10-CM | POA: Diagnosis not present

## 2022-06-26 DIAGNOSIS — Z9981 Dependence on supplemental oxygen: Secondary | ICD-10-CM | POA: Diagnosis not present

## 2022-06-26 DIAGNOSIS — J189 Pneumonia, unspecified organism: Secondary | ICD-10-CM | POA: Diagnosis not present

## 2022-06-26 DIAGNOSIS — J9621 Acute and chronic respiratory failure with hypoxia: Secondary | ICD-10-CM | POA: Diagnosis not present

## 2022-06-26 DIAGNOSIS — J441 Chronic obstructive pulmonary disease with (acute) exacerbation: Secondary | ICD-10-CM | POA: Diagnosis not present

## 2022-06-26 DIAGNOSIS — I110 Hypertensive heart disease with heart failure: Secondary | ICD-10-CM | POA: Diagnosis not present

## 2022-06-26 DIAGNOSIS — J440 Chronic obstructive pulmonary disease with acute lower respiratory infection: Secondary | ICD-10-CM | POA: Diagnosis not present

## 2022-06-26 DIAGNOSIS — J9622 Acute and chronic respiratory failure with hypercapnia: Secondary | ICD-10-CM | POA: Diagnosis not present

## 2022-06-26 DIAGNOSIS — B338 Other specified viral diseases: Secondary | ICD-10-CM | POA: Diagnosis not present

## 2022-06-26 DIAGNOSIS — E119 Type 2 diabetes mellitus without complications: Secondary | ICD-10-CM | POA: Diagnosis not present

## 2022-06-27 DIAGNOSIS — I509 Heart failure, unspecified: Secondary | ICD-10-CM | POA: Diagnosis not present

## 2022-06-27 DIAGNOSIS — J189 Pneumonia, unspecified organism: Secondary | ICD-10-CM | POA: Diagnosis not present

## 2022-06-27 DIAGNOSIS — J9621 Acute and chronic respiratory failure with hypoxia: Secondary | ICD-10-CM | POA: Diagnosis not present

## 2022-06-27 DIAGNOSIS — B338 Other specified viral diseases: Secondary | ICD-10-CM | POA: Diagnosis not present

## 2022-06-27 DIAGNOSIS — H524 Presbyopia: Secondary | ICD-10-CM | POA: Diagnosis not present

## 2022-06-27 DIAGNOSIS — J441 Chronic obstructive pulmonary disease with (acute) exacerbation: Secondary | ICD-10-CM | POA: Diagnosis not present

## 2022-06-27 DIAGNOSIS — J440 Chronic obstructive pulmonary disease with acute lower respiratory infection: Secondary | ICD-10-CM | POA: Diagnosis not present

## 2022-06-27 DIAGNOSIS — J9622 Acute and chronic respiratory failure with hypercapnia: Secondary | ICD-10-CM | POA: Diagnosis not present

## 2022-06-27 DIAGNOSIS — I110 Hypertensive heart disease with heart failure: Secondary | ICD-10-CM | POA: Diagnosis not present

## 2022-06-30 DIAGNOSIS — B338 Other specified viral diseases: Secondary | ICD-10-CM | POA: Diagnosis not present

## 2022-06-30 DIAGNOSIS — J189 Pneumonia, unspecified organism: Secondary | ICD-10-CM | POA: Diagnosis not present

## 2022-06-30 DIAGNOSIS — J9621 Acute and chronic respiratory failure with hypoxia: Secondary | ICD-10-CM | POA: Diagnosis not present

## 2022-06-30 DIAGNOSIS — J441 Chronic obstructive pulmonary disease with (acute) exacerbation: Secondary | ICD-10-CM | POA: Diagnosis not present

## 2022-06-30 DIAGNOSIS — E119 Type 2 diabetes mellitus without complications: Secondary | ICD-10-CM | POA: Diagnosis not present

## 2022-06-30 DIAGNOSIS — I110 Hypertensive heart disease with heart failure: Secondary | ICD-10-CM | POA: Diagnosis not present

## 2022-06-30 DIAGNOSIS — I509 Heart failure, unspecified: Secondary | ICD-10-CM | POA: Diagnosis not present

## 2022-06-30 DIAGNOSIS — J9622 Acute and chronic respiratory failure with hypercapnia: Secondary | ICD-10-CM | POA: Diagnosis not present

## 2022-06-30 DIAGNOSIS — Z9981 Dependence on supplemental oxygen: Secondary | ICD-10-CM | POA: Diagnosis not present

## 2022-06-30 DIAGNOSIS — J440 Chronic obstructive pulmonary disease with acute lower respiratory infection: Secondary | ICD-10-CM | POA: Diagnosis not present

## 2022-07-02 DIAGNOSIS — I509 Heart failure, unspecified: Secondary | ICD-10-CM | POA: Diagnosis not present

## 2022-07-02 DIAGNOSIS — J9621 Acute and chronic respiratory failure with hypoxia: Secondary | ICD-10-CM | POA: Diagnosis not present

## 2022-07-02 DIAGNOSIS — I110 Hypertensive heart disease with heart failure: Secondary | ICD-10-CM | POA: Diagnosis not present

## 2022-07-02 DIAGNOSIS — Z9981 Dependence on supplemental oxygen: Secondary | ICD-10-CM | POA: Diagnosis not present

## 2022-07-02 DIAGNOSIS — E119 Type 2 diabetes mellitus without complications: Secondary | ICD-10-CM | POA: Diagnosis not present

## 2022-07-02 DIAGNOSIS — J9622 Acute and chronic respiratory failure with hypercapnia: Secondary | ICD-10-CM | POA: Diagnosis not present

## 2022-07-02 DIAGNOSIS — J189 Pneumonia, unspecified organism: Secondary | ICD-10-CM | POA: Diagnosis not present

## 2022-07-02 DIAGNOSIS — J441 Chronic obstructive pulmonary disease with (acute) exacerbation: Secondary | ICD-10-CM | POA: Diagnosis not present

## 2022-07-02 DIAGNOSIS — J440 Chronic obstructive pulmonary disease with acute lower respiratory infection: Secondary | ICD-10-CM | POA: Diagnosis not present

## 2022-07-02 DIAGNOSIS — B338 Other specified viral diseases: Secondary | ICD-10-CM | POA: Diagnosis not present

## 2022-07-06 DIAGNOSIS — J9610 Chronic respiratory failure, unspecified whether with hypoxia or hypercapnia: Secondary | ICD-10-CM | POA: Diagnosis not present

## 2022-07-06 DIAGNOSIS — J449 Chronic obstructive pulmonary disease, unspecified: Secondary | ICD-10-CM | POA: Diagnosis not present

## 2022-07-08 DIAGNOSIS — J440 Chronic obstructive pulmonary disease with acute lower respiratory infection: Secondary | ICD-10-CM | POA: Diagnosis not present

## 2022-07-08 DIAGNOSIS — I509 Heart failure, unspecified: Secondary | ICD-10-CM | POA: Diagnosis not present

## 2022-07-08 DIAGNOSIS — I110 Hypertensive heart disease with heart failure: Secondary | ICD-10-CM | POA: Diagnosis not present

## 2022-07-08 DIAGNOSIS — J189 Pneumonia, unspecified organism: Secondary | ICD-10-CM | POA: Diagnosis not present

## 2022-07-08 DIAGNOSIS — E119 Type 2 diabetes mellitus without complications: Secondary | ICD-10-CM | POA: Diagnosis not present

## 2022-07-08 DIAGNOSIS — B338 Other specified viral diseases: Secondary | ICD-10-CM | POA: Diagnosis not present

## 2022-07-08 DIAGNOSIS — J441 Chronic obstructive pulmonary disease with (acute) exacerbation: Secondary | ICD-10-CM | POA: Diagnosis not present

## 2022-07-08 DIAGNOSIS — J9621 Acute and chronic respiratory failure with hypoxia: Secondary | ICD-10-CM | POA: Diagnosis not present

## 2022-07-08 DIAGNOSIS — Z9981 Dependence on supplemental oxygen: Secondary | ICD-10-CM | POA: Diagnosis not present

## 2022-07-08 DIAGNOSIS — J9622 Acute and chronic respiratory failure with hypercapnia: Secondary | ICD-10-CM | POA: Diagnosis not present

## 2022-07-10 DIAGNOSIS — Z6839 Body mass index (BMI) 39.0-39.9, adult: Secondary | ICD-10-CM | POA: Diagnosis not present

## 2022-07-10 DIAGNOSIS — N189 Chronic kidney disease, unspecified: Secondary | ICD-10-CM | POA: Diagnosis not present

## 2022-07-10 DIAGNOSIS — J449 Chronic obstructive pulmonary disease, unspecified: Secondary | ICD-10-CM | POA: Diagnosis not present

## 2022-07-10 DIAGNOSIS — G629 Polyneuropathy, unspecified: Secondary | ICD-10-CM | POA: Diagnosis not present

## 2022-07-10 DIAGNOSIS — E559 Vitamin D deficiency, unspecified: Secondary | ICD-10-CM | POA: Diagnosis not present

## 2022-07-10 DIAGNOSIS — I10 Essential (primary) hypertension: Secondary | ICD-10-CM | POA: Diagnosis not present

## 2022-07-14 DIAGNOSIS — B338 Other specified viral diseases: Secondary | ICD-10-CM | POA: Diagnosis not present

## 2022-07-14 DIAGNOSIS — J189 Pneumonia, unspecified organism: Secondary | ICD-10-CM | POA: Diagnosis not present

## 2022-07-14 DIAGNOSIS — Z9981 Dependence on supplemental oxygen: Secondary | ICD-10-CM | POA: Diagnosis not present

## 2022-07-14 DIAGNOSIS — I509 Heart failure, unspecified: Secondary | ICD-10-CM | POA: Diagnosis not present

## 2022-07-14 DIAGNOSIS — I110 Hypertensive heart disease with heart failure: Secondary | ICD-10-CM | POA: Diagnosis not present

## 2022-07-14 DIAGNOSIS — J9621 Acute and chronic respiratory failure with hypoxia: Secondary | ICD-10-CM | POA: Diagnosis not present

## 2022-07-14 DIAGNOSIS — J441 Chronic obstructive pulmonary disease with (acute) exacerbation: Secondary | ICD-10-CM | POA: Diagnosis not present

## 2022-07-14 DIAGNOSIS — J440 Chronic obstructive pulmonary disease with acute lower respiratory infection: Secondary | ICD-10-CM | POA: Diagnosis not present

## 2022-07-14 DIAGNOSIS — J9622 Acute and chronic respiratory failure with hypercapnia: Secondary | ICD-10-CM | POA: Diagnosis not present

## 2022-07-14 DIAGNOSIS — E119 Type 2 diabetes mellitus without complications: Secondary | ICD-10-CM | POA: Diagnosis not present

## 2022-07-18 DIAGNOSIS — G4733 Obstructive sleep apnea (adult) (pediatric): Secondary | ICD-10-CM | POA: Diagnosis not present

## 2022-07-18 DIAGNOSIS — J9610 Chronic respiratory failure, unspecified whether with hypoxia or hypercapnia: Secondary | ICD-10-CM | POA: Diagnosis not present

## 2022-08-06 DIAGNOSIS — J449 Chronic obstructive pulmonary disease, unspecified: Secondary | ICD-10-CM | POA: Diagnosis not present

## 2022-08-06 DIAGNOSIS — J9610 Chronic respiratory failure, unspecified whether with hypoxia or hypercapnia: Secondary | ICD-10-CM | POA: Diagnosis not present

## 2022-08-18 DIAGNOSIS — J9610 Chronic respiratory failure, unspecified whether with hypoxia or hypercapnia: Secondary | ICD-10-CM | POA: Diagnosis not present

## 2022-08-18 DIAGNOSIS — G4733 Obstructive sleep apnea (adult) (pediatric): Secondary | ICD-10-CM | POA: Diagnosis not present

## 2022-08-26 DIAGNOSIS — G9009 Other idiopathic peripheral autonomic neuropathy: Secondary | ICD-10-CM | POA: Diagnosis not present

## 2022-08-26 DIAGNOSIS — I739 Peripheral vascular disease, unspecified: Secondary | ICD-10-CM | POA: Diagnosis not present

## 2022-08-26 DIAGNOSIS — M2142 Flat foot [pes planus] (acquired), left foot: Secondary | ICD-10-CM | POA: Diagnosis not present

## 2022-08-26 DIAGNOSIS — G629 Polyneuropathy, unspecified: Secondary | ICD-10-CM | POA: Diagnosis not present

## 2022-08-26 DIAGNOSIS — M2141 Flat foot [pes planus] (acquired), right foot: Secondary | ICD-10-CM | POA: Diagnosis not present

## 2022-08-28 DIAGNOSIS — J9612 Chronic respiratory failure with hypercapnia: Secondary | ICD-10-CM | POA: Diagnosis not present

## 2022-08-28 DIAGNOSIS — D860 Sarcoidosis of lung: Secondary | ICD-10-CM | POA: Diagnosis not present

## 2022-08-28 DIAGNOSIS — J9611 Chronic respiratory failure with hypoxia: Secondary | ICD-10-CM | POA: Diagnosis not present

## 2022-08-28 DIAGNOSIS — G4733 Obstructive sleep apnea (adult) (pediatric): Secondary | ICD-10-CM | POA: Diagnosis not present

## 2022-08-28 DIAGNOSIS — Z515 Encounter for palliative care: Secondary | ICD-10-CM | POA: Diagnosis not present

## 2022-08-28 DIAGNOSIS — J986 Disorders of diaphragm: Secondary | ICD-10-CM | POA: Diagnosis not present

## 2022-08-28 DIAGNOSIS — J449 Chronic obstructive pulmonary disease, unspecified: Secondary | ICD-10-CM | POA: Diagnosis not present

## 2022-08-28 DIAGNOSIS — I10 Essential (primary) hypertension: Secondary | ICD-10-CM | POA: Diagnosis not present

## 2022-09-06 DIAGNOSIS — J9610 Chronic respiratory failure, unspecified whether with hypoxia or hypercapnia: Secondary | ICD-10-CM | POA: Diagnosis not present

## 2022-09-06 DIAGNOSIS — J449 Chronic obstructive pulmonary disease, unspecified: Secondary | ICD-10-CM | POA: Diagnosis not present

## 2022-09-18 DIAGNOSIS — J9610 Chronic respiratory failure, unspecified whether with hypoxia or hypercapnia: Secondary | ICD-10-CM | POA: Diagnosis not present

## 2022-09-18 DIAGNOSIS — G4733 Obstructive sleep apnea (adult) (pediatric): Secondary | ICD-10-CM | POA: Diagnosis not present

## 2022-09-30 ENCOUNTER — Encounter: Payer: Self-pay | Admitting: Vascular Surgery

## 2022-09-30 DIAGNOSIS — E042 Nontoxic multinodular goiter: Secondary | ICD-10-CM

## 2022-09-30 HISTORY — DX: Nontoxic multinodular goiter: E04.2

## 2022-10-01 ENCOUNTER — Encounter: Payer: Self-pay | Admitting: Vascular Surgery

## 2022-10-01 ENCOUNTER — Ambulatory Visit: Payer: Medicare HMO | Admitting: Vascular Surgery

## 2022-10-01 VITALS — BP 133/81 | HR 82 | Temp 98.1°F | Ht 65.0 in | Wt 212.8 lb

## 2022-10-01 DIAGNOSIS — M79605 Pain in left leg: Secondary | ICD-10-CM | POA: Diagnosis not present

## 2022-10-01 DIAGNOSIS — M79604 Pain in right leg: Secondary | ICD-10-CM | POA: Diagnosis not present

## 2022-10-01 NOTE — Progress Notes (Signed)
[facility] [place]  Patient name: Shelley Gonzalez MRN: [id number] DOB: 1940-08-23 Sex: female  REASON FOR CONSULT: Evaluation lower extremity pain, rule out arterial insufficiency  HPI: Shelley Gonzalez is a 82 y.o. female, who is here today for evaluation.  She is here with her daughter.  He has complaints of numbness in her fourth and fifth toe most particularly on the left but can have some difficulty on her right foot as well.  She reports also sensation of coldness in her feet.  He does not have any history of tissue loss.  She also is concerned regarding some darkened discoloration above her ankle anteriorly on the left.  She does have chronic swelling bilaterally.  She walks with the use of a rolling walker and does not walk to the level of claudication.  Past Medical History:  Diagnosis Date   Acute on chronic congestive heart failure ([place]) 04/28/2021   Acute on chronic respiratory failure with hypoxia and hypercapnia ([facility]) 04/28/2021   Acute respiratory failure with hypoxia and hypercapnia ([facility]) 06/02/2022   Chronic respiratory failure with hypoxia and hypercapnia ([facility]) 06/11/2017   Community acquired pneumonia 06/02/2022   COPD exacerbation ([place]) 04/28/2021   Elevated brain natriuretic peptide (BNP) level 04/28/2021   Elevated serum creatinine 06/02/2022   Elevated WBCs 04/28/2021   GERD (gastroesophageal reflux disease)    Goiter, nontoxic, multinodular 09/30/2022   Hiatal hernia    High serum lactate 04/28/2021   History of noncompliance with medical treatment 04/28/2021   Formatting of this note might be different from the original. Forgets to take her meds (despite living w/ daughter and despite daughter getting her a pill box)   HTN (hypertension) 04/28/2021   Hyperlipidemia    Hypertension    Hypoxemia 05/27/2021   OSA (obstructive sleep apnea) 04/28/2021   Other secondary pulmonary hypertension ([place])  06/11/2017   Paralysis, diaphragm 04/28/2021   Pericardial effusion 06/11/2017   RSV (respiratory syncytial virus infection) 05/24/2021   Sarcoidosis    Type 2 diabetes mellitus ([place]) 04/28/2021    Family History  Problem Relation Age of Onset   Ovarian cancer Mother    Heart disease Mother    Heart attack Brother    Breast cancer Daughter    Ovarian cancer Daughter    Pancreatic cancer Daughter     SOCIAL HISTORY: Social History   Socioeconomic History   Marital status: Widowed    Spouse name: Not on file   Number of children: Not on file   Years of education: Not on file   Highest education level: Not on file  Occupational History   Not on file  Tobacco Use   Smoking status: Never   Smokeless tobacco: Never  Vaping Use   Vaping Use: Never used  Substance and Sexual Activity   Alcohol use: Not Currently   Drug use: Not Currently   Sexual activity: Not on file  Other Topics Concern   Not on file  Social History Narrative   Not on file   Social Determinants of Health   Financial Resource Strain: Not on file  Food Insecurity: Not on file  Transportation Needs: Not on file  Physical Activity: Not on file  Stress: Not on file  Social Connections: Not on file  Intimate Partner Violence: Not on file    Allergies  Allergen Reactions   Peanut-Containing Drug Products Cough    sneezing    Current Outpatient Medications  Medication Sig Dispense Refill  albuterol (VENTOLIN HFA) 108 (90 Base) MCG/ACT inhaler Inhale 1-2 puffs into the lungs every 6 (six) hours as needed for wheezing or shortness of breath.     amLODipine (NORVASC) 5 MG tablet Take 5 mg by mouth daily.     furosemide (LASIX) 40 MG tablet Take 20 mg by mouth every morning.   2   moxifloxacin (VIGAMOX) 0.5 % ophthalmic solution Apply to eye.     NIFEdipine (PROCARDIA XL/NIFEDICAL-XL) 90 MG 24 hr tablet Take 90 mg by mouth daily.     valsartan-hydrochlorothiazide (DIOVAN-HCT) 320-25 MG tablet Take 1  tablet by mouth daily.     No current facility-administered medications for this visit.    REVIEW OF SYSTEMS:  '[X]'$  denotes positive finding, '[ ]'$  denotes negative finding Cardiac  Comments:  Chest pain or chest pressure:    Shortness of breath upon exertion: x   Short of breath when lying flat: x   Irregular heart rhythm:        Vascular    Pain in calf, thigh, or hip brought on by ambulation: x   Pain in feet at night that wakes you up from your sleep:  x   Blood clot in your veins:    Leg swelling:         Pulmonary    Oxygen at home: x   Productive cough:  x   Wheezing:         Neurologic    Sudden weakness in arms or legs:     Sudden numbness in arms or legs:  x   Sudden onset of difficulty speaking or slurred speech:    Temporary loss of vision in one eye:     Problems with dizziness:         Gastrointestinal    Blood in stool:     Vomited blood:         Genitourinary    Burning when urinating:     Blood in urine:        Psychiatric    Major depression:         Hematologic    Bleeding problems:    Problems with blood clotting too easily:        Skin    Rashes or ulcers:        Constitutional    Fever or chills:      PHYSICAL EXAM: Vitals:   10/01/22 1106  BP: 133/81  Pulse: 82  Temp: 98.1 F (36.7 C)  SpO2: (!) 85%  Weight: 212 lb 12.8 oz (96.5 kg)  Height: '5\' 5"'$  (1.651 m)    GENERAL: The patient is a well-nourished female, in no acute distress. The vital signs are documented above. CARDIOVASCULAR: 2+ radial pulses bilaterally.  I do not palpate pedal pulses bilaterally.  She does have pitting edema bilaterally.  She does have some hemosiderin deposits most particularly over her left distal pretibial area. PULMONARY: There is good air exchange  MUSCULOSKELETAL: There are no major deformities or cyanosis. NEUROLOGIC: No focal weakness or paresthesias are detected. SKIN: There are no ulcers or rashes noted. PSYCHIATRIC: The patient has a normal  affect.  DATA:  Noninvasive studies from [facility] on 08/28/2022 were reviewed.  This reveals ankle arm index of 0.90 on the right and 0.96 on the left.  She has multiphasic waveforms.  She does have some diminished flow at the digital level.  MEDICAL ISSUES: Mild lower extremity arterial insufficiency.  I do not feel this is the cause  of her lower extremity discomfort.  This appears to be more neuropathic.  I reassured her that there is no risk for limb loss or tissue loss related to her mild ischemia.  She is on diuretic therapy.  I do not feel that she could tolerate compression garments for her swelling and also reassured her that the hemosiderin deposits are benign.  She will see [place] again on an as-needed basis   Rosetta Posner, MD [facility] Tel [phone] Pager [phone]  Note: Portions of this report may have been transcribed using voice recognition software.  Every effort has been made to ensure accuracy; however, inadvertent computerized transcription errors may still be present.

## 2022-10-05 DIAGNOSIS — J9610 Chronic respiratory failure, unspecified whether with hypoxia or hypercapnia: Secondary | ICD-10-CM | POA: Diagnosis not present

## 2022-10-05 DIAGNOSIS — J449 Chronic obstructive pulmonary disease, unspecified: Secondary | ICD-10-CM | POA: Diagnosis not present

## 2022-10-08 DIAGNOSIS — D860 Sarcoidosis of lung: Secondary | ICD-10-CM | POA: Diagnosis not present

## 2022-10-08 DIAGNOSIS — Z515 Encounter for palliative care: Secondary | ICD-10-CM | POA: Diagnosis not present

## 2022-10-08 DIAGNOSIS — J9612 Chronic respiratory failure with hypercapnia: Secondary | ICD-10-CM | POA: Diagnosis not present

## 2022-10-08 DIAGNOSIS — G4733 Obstructive sleep apnea (adult) (pediatric): Secondary | ICD-10-CM | POA: Diagnosis not present

## 2022-10-08 DIAGNOSIS — I10 Essential (primary) hypertension: Secondary | ICD-10-CM | POA: Diagnosis not present

## 2022-10-08 DIAGNOSIS — J449 Chronic obstructive pulmonary disease, unspecified: Secondary | ICD-10-CM | POA: Diagnosis not present

## 2022-10-08 DIAGNOSIS — J986 Disorders of diaphragm: Secondary | ICD-10-CM | POA: Diagnosis not present

## 2022-10-08 DIAGNOSIS — J9611 Chronic respiratory failure with hypoxia: Secondary | ICD-10-CM | POA: Diagnosis not present

## 2022-10-17 DIAGNOSIS — G4733 Obstructive sleep apnea (adult) (pediatric): Secondary | ICD-10-CM | POA: Diagnosis not present

## 2022-10-17 DIAGNOSIS — J9610 Chronic respiratory failure, unspecified whether with hypoxia or hypercapnia: Secondary | ICD-10-CM | POA: Diagnosis not present

## 2022-11-05 DIAGNOSIS — J9610 Chronic respiratory failure, unspecified whether with hypoxia or hypercapnia: Secondary | ICD-10-CM | POA: Diagnosis not present

## 2022-11-05 DIAGNOSIS — J449 Chronic obstructive pulmonary disease, unspecified: Secondary | ICD-10-CM | POA: Diagnosis not present

## 2022-11-06 DIAGNOSIS — J9610 Chronic respiratory failure, unspecified whether with hypoxia or hypercapnia: Secondary | ICD-10-CM | POA: Diagnosis not present

## 2022-11-06 DIAGNOSIS — G4733 Obstructive sleep apnea (adult) (pediatric): Secondary | ICD-10-CM | POA: Diagnosis not present

## 2022-11-17 DIAGNOSIS — J9610 Chronic respiratory failure, unspecified whether with hypoxia or hypercapnia: Secondary | ICD-10-CM | POA: Diagnosis not present

## 2022-11-17 DIAGNOSIS — G4733 Obstructive sleep apnea (adult) (pediatric): Secondary | ICD-10-CM | POA: Diagnosis not present

## 2022-11-20 DIAGNOSIS — J9611 Chronic respiratory failure with hypoxia: Secondary | ICD-10-CM | POA: Diagnosis not present

## 2022-11-20 DIAGNOSIS — I10 Essential (primary) hypertension: Secondary | ICD-10-CM | POA: Diagnosis not present

## 2022-11-20 DIAGNOSIS — J986 Disorders of diaphragm: Secondary | ICD-10-CM | POA: Diagnosis not present

## 2022-11-20 DIAGNOSIS — Z9989 Dependence on other enabling machines and devices: Secondary | ICD-10-CM | POA: Diagnosis not present

## 2022-11-20 DIAGNOSIS — J9612 Chronic respiratory failure with hypercapnia: Secondary | ICD-10-CM | POA: Diagnosis not present

## 2022-11-20 DIAGNOSIS — Z515 Encounter for palliative care: Secondary | ICD-10-CM | POA: Diagnosis not present

## 2022-11-20 DIAGNOSIS — M6281 Muscle weakness (generalized): Secondary | ICD-10-CM | POA: Diagnosis not present

## 2022-11-20 DIAGNOSIS — J449 Chronic obstructive pulmonary disease, unspecified: Secondary | ICD-10-CM | POA: Diagnosis not present

## 2022-12-05 DIAGNOSIS — J9610 Chronic respiratory failure, unspecified whether with hypoxia or hypercapnia: Secondary | ICD-10-CM | POA: Diagnosis not present

## 2022-12-05 DIAGNOSIS — J449 Chronic obstructive pulmonary disease, unspecified: Secondary | ICD-10-CM | POA: Diagnosis not present

## 2022-12-17 DIAGNOSIS — G4733 Obstructive sleep apnea (adult) (pediatric): Secondary | ICD-10-CM | POA: Diagnosis not present

## 2022-12-17 DIAGNOSIS — J9610 Chronic respiratory failure, unspecified whether with hypoxia or hypercapnia: Secondary | ICD-10-CM | POA: Diagnosis not present

## 2023-01-02 DIAGNOSIS — J449 Chronic obstructive pulmonary disease, unspecified: Secondary | ICD-10-CM | POA: Diagnosis not present

## 2023-01-02 DIAGNOSIS — R0600 Dyspnea, unspecified: Secondary | ICD-10-CM | POA: Diagnosis not present

## 2023-01-02 DIAGNOSIS — Z515 Encounter for palliative care: Secondary | ICD-10-CM | POA: Diagnosis not present

## 2023-01-02 DIAGNOSIS — I10 Essential (primary) hypertension: Secondary | ICD-10-CM | POA: Diagnosis not present

## 2023-01-05 DIAGNOSIS — J449 Chronic obstructive pulmonary disease, unspecified: Secondary | ICD-10-CM | POA: Diagnosis not present

## 2023-01-05 DIAGNOSIS — J9610 Chronic respiratory failure, unspecified whether with hypoxia or hypercapnia: Secondary | ICD-10-CM | POA: Diagnosis not present

## 2023-01-17 DIAGNOSIS — R269 Unspecified abnormalities of gait and mobility: Secondary | ICD-10-CM | POA: Diagnosis not present

## 2023-01-17 DIAGNOSIS — R062 Wheezing: Secondary | ICD-10-CM | POA: Diagnosis not present

## 2023-01-17 DIAGNOSIS — D860 Sarcoidosis of lung: Secondary | ICD-10-CM | POA: Diagnosis not present

## 2023-02-04 DIAGNOSIS — J449 Chronic obstructive pulmonary disease, unspecified: Secondary | ICD-10-CM | POA: Diagnosis not present

## 2023-02-04 DIAGNOSIS — J9610 Chronic respiratory failure, unspecified whether with hypoxia or hypercapnia: Secondary | ICD-10-CM | POA: Diagnosis not present

## 2023-02-20 DIAGNOSIS — Z515 Encounter for palliative care: Secondary | ICD-10-CM | POA: Diagnosis not present

## 2023-02-20 DIAGNOSIS — J449 Chronic obstructive pulmonary disease, unspecified: Secondary | ICD-10-CM | POA: Diagnosis not present

## 2023-02-20 DIAGNOSIS — R0600 Dyspnea, unspecified: Secondary | ICD-10-CM | POA: Diagnosis not present

## 2023-02-20 DIAGNOSIS — I10 Essential (primary) hypertension: Secondary | ICD-10-CM | POA: Diagnosis not present

## 2023-03-07 DIAGNOSIS — J9610 Chronic respiratory failure, unspecified whether with hypoxia or hypercapnia: Secondary | ICD-10-CM | POA: Diagnosis not present

## 2023-03-07 DIAGNOSIS — J449 Chronic obstructive pulmonary disease, unspecified: Secondary | ICD-10-CM | POA: Diagnosis not present

## 2023-03-19 DIAGNOSIS — D860 Sarcoidosis of lung: Secondary | ICD-10-CM | POA: Diagnosis not present

## 2023-03-19 DIAGNOSIS — R269 Unspecified abnormalities of gait and mobility: Secondary | ICD-10-CM | POA: Diagnosis not present

## 2023-03-19 DIAGNOSIS — R062 Wheezing: Secondary | ICD-10-CM | POA: Diagnosis not present

## 2023-03-20 DIAGNOSIS — J9610 Chronic respiratory failure, unspecified whether with hypoxia or hypercapnia: Secondary | ICD-10-CM | POA: Diagnosis not present

## 2023-03-20 DIAGNOSIS — G4733 Obstructive sleep apnea (adult) (pediatric): Secondary | ICD-10-CM | POA: Diagnosis not present

## 2023-04-07 DIAGNOSIS — J449 Chronic obstructive pulmonary disease, unspecified: Secondary | ICD-10-CM | POA: Diagnosis not present

## 2023-04-07 DIAGNOSIS — J9610 Chronic respiratory failure, unspecified whether with hypoxia or hypercapnia: Secondary | ICD-10-CM | POA: Diagnosis not present

## 2023-04-10 DIAGNOSIS — Z8249 Family history of ischemic heart disease and other diseases of the circulatory system: Secondary | ICD-10-CM | POA: Diagnosis not present

## 2023-04-10 DIAGNOSIS — R269 Unspecified abnormalities of gait and mobility: Secondary | ICD-10-CM | POA: Diagnosis not present

## 2023-04-10 DIAGNOSIS — J449 Chronic obstructive pulmonary disease, unspecified: Secondary | ICD-10-CM | POA: Diagnosis not present

## 2023-04-10 DIAGNOSIS — G4733 Obstructive sleep apnea (adult) (pediatric): Secondary | ICD-10-CM | POA: Diagnosis not present

## 2023-04-10 DIAGNOSIS — E1122 Type 2 diabetes mellitus with diabetic chronic kidney disease: Secondary | ICD-10-CM | POA: Diagnosis not present

## 2023-04-10 DIAGNOSIS — E1142 Type 2 diabetes mellitus with diabetic polyneuropathy: Secondary | ICD-10-CM | POA: Diagnosis not present

## 2023-04-10 DIAGNOSIS — J9610 Chronic respiratory failure, unspecified whether with hypoxia or hypercapnia: Secondary | ICD-10-CM | POA: Diagnosis not present

## 2023-04-10 DIAGNOSIS — M1990 Unspecified osteoarthritis, unspecified site: Secondary | ICD-10-CM | POA: Diagnosis not present

## 2023-04-10 DIAGNOSIS — I130 Hypertensive heart and chronic kidney disease with heart failure and stage 1 through stage 4 chronic kidney disease, or unspecified chronic kidney disease: Secondary | ICD-10-CM | POA: Diagnosis not present

## 2023-04-10 DIAGNOSIS — I509 Heart failure, unspecified: Secondary | ICD-10-CM | POA: Diagnosis not present

## 2023-04-10 DIAGNOSIS — N189 Chronic kidney disease, unspecified: Secondary | ICD-10-CM | POA: Diagnosis not present

## 2023-04-12 DIAGNOSIS — Z6835 Body mass index (BMI) 35.0-35.9, adult: Secondary | ICD-10-CM | POA: Diagnosis not present

## 2023-04-12 DIAGNOSIS — S6991XA Unspecified injury of right wrist, hand and finger(s), initial encounter: Secondary | ICD-10-CM | POA: Diagnosis not present

## 2023-04-12 DIAGNOSIS — M79644 Pain in right finger(s): Secondary | ICD-10-CM | POA: Diagnosis not present

## 2023-04-12 DIAGNOSIS — E669 Obesity, unspecified: Secondary | ICD-10-CM | POA: Diagnosis not present

## 2023-04-12 DIAGNOSIS — I10 Essential (primary) hypertension: Secondary | ICD-10-CM | POA: Diagnosis not present

## 2023-04-19 DIAGNOSIS — D860 Sarcoidosis of lung: Secondary | ICD-10-CM | POA: Diagnosis not present

## 2023-04-19 DIAGNOSIS — R062 Wheezing: Secondary | ICD-10-CM | POA: Diagnosis not present

## 2023-04-19 DIAGNOSIS — R269 Unspecified abnormalities of gait and mobility: Secondary | ICD-10-CM | POA: Diagnosis not present

## 2023-04-20 DIAGNOSIS — G4733 Obstructive sleep apnea (adult) (pediatric): Secondary | ICD-10-CM | POA: Diagnosis not present

## 2023-04-20 DIAGNOSIS — J9610 Chronic respiratory failure, unspecified whether with hypoxia or hypercapnia: Secondary | ICD-10-CM | POA: Diagnosis not present

## 2023-04-22 DIAGNOSIS — Z515 Encounter for palliative care: Secondary | ICD-10-CM | POA: Diagnosis not present

## 2023-04-22 DIAGNOSIS — J449 Chronic obstructive pulmonary disease, unspecified: Secondary | ICD-10-CM | POA: Diagnosis not present

## 2023-04-30 DIAGNOSIS — S6991XS Unspecified injury of right wrist, hand and finger(s), sequela: Secondary | ICD-10-CM | POA: Diagnosis not present

## 2023-04-30 DIAGNOSIS — S63259S Unspecified dislocation of unspecified finger, sequela: Secondary | ICD-10-CM | POA: Diagnosis not present

## 2023-04-30 DIAGNOSIS — M79644 Pain in right finger(s): Secondary | ICD-10-CM | POA: Diagnosis not present

## 2023-04-30 DIAGNOSIS — S63124A Dislocation of unspecified interphalangeal joint of right thumb, initial encounter: Secondary | ICD-10-CM | POA: Diagnosis not present

## 2023-05-07 DIAGNOSIS — J449 Chronic obstructive pulmonary disease, unspecified: Secondary | ICD-10-CM | POA: Diagnosis not present

## 2023-05-07 DIAGNOSIS — J9610 Chronic respiratory failure, unspecified whether with hypoxia or hypercapnia: Secondary | ICD-10-CM | POA: Diagnosis not present

## 2023-05-19 DIAGNOSIS — R062 Wheezing: Secondary | ICD-10-CM | POA: Diagnosis not present

## 2023-05-19 DIAGNOSIS — D860 Sarcoidosis of lung: Secondary | ICD-10-CM | POA: Diagnosis not present

## 2023-05-19 DIAGNOSIS — R269 Unspecified abnormalities of gait and mobility: Secondary | ICD-10-CM | POA: Diagnosis not present

## 2023-05-20 DIAGNOSIS — G4733 Obstructive sleep apnea (adult) (pediatric): Secondary | ICD-10-CM | POA: Diagnosis not present

## 2023-05-20 DIAGNOSIS — J9610 Chronic respiratory failure, unspecified whether with hypoxia or hypercapnia: Secondary | ICD-10-CM | POA: Diagnosis not present

## 2023-06-01 ENCOUNTER — Telehealth: Payer: Self-pay | Admitting: Primary Care

## 2023-06-01 NOTE — Telephone Encounter (Signed)
LVM for patient to call and discuss rescheduling 12/2/424 appointment with Shelley Dura, NP---office is closed for the [facility]

## 2023-06-03 NOTE — Telephone Encounter (Signed)
LVM for patient regarding updated over due appointment---Thursday 08/20/23 at 1:30 pm with Rubye Oaks, NP.  Will mail information to patient and asked she call the office with questions or concerns.

## 2023-06-07 DIAGNOSIS — J9610 Chronic respiratory failure, unspecified whether with hypoxia or hypercapnia: Secondary | ICD-10-CM | POA: Diagnosis not present

## 2023-06-07 DIAGNOSIS — J449 Chronic obstructive pulmonary disease, unspecified: Secondary | ICD-10-CM | POA: Diagnosis not present

## 2023-06-19 DIAGNOSIS — R062 Wheezing: Secondary | ICD-10-CM | POA: Diagnosis not present

## 2023-06-19 DIAGNOSIS — D860 Sarcoidosis of lung: Secondary | ICD-10-CM | POA: Diagnosis not present

## 2023-06-19 DIAGNOSIS — R269 Unspecified abnormalities of gait and mobility: Secondary | ICD-10-CM | POA: Diagnosis not present

## 2023-07-07 DIAGNOSIS — J9610 Chronic respiratory failure, unspecified whether with hypoxia or hypercapnia: Secondary | ICD-10-CM | POA: Diagnosis not present

## 2023-07-07 DIAGNOSIS — J449 Chronic obstructive pulmonary disease, unspecified: Secondary | ICD-10-CM | POA: Diagnosis not present

## 2023-07-19 DIAGNOSIS — R062 Wheezing: Secondary | ICD-10-CM | POA: Diagnosis not present

## 2023-07-19 DIAGNOSIS — R269 Unspecified abnormalities of gait and mobility: Secondary | ICD-10-CM | POA: Diagnosis not present

## 2023-07-19 DIAGNOSIS — D860 Sarcoidosis of lung: Secondary | ICD-10-CM | POA: Diagnosis not present

## 2023-07-21 ENCOUNTER — Ambulatory Visit: Payer: Medicare HMO | Admitting: Primary Care

## 2023-08-07 DIAGNOSIS — J449 Chronic obstructive pulmonary disease, unspecified: Secondary | ICD-10-CM | POA: Diagnosis not present

## 2023-08-07 DIAGNOSIS — J9610 Chronic respiratory failure, unspecified whether with hypoxia or hypercapnia: Secondary | ICD-10-CM | POA: Diagnosis not present

## 2023-08-19 DIAGNOSIS — D860 Sarcoidosis of lung: Secondary | ICD-10-CM | POA: Diagnosis not present

## 2023-08-19 DIAGNOSIS — R062 Wheezing: Secondary | ICD-10-CM | POA: Diagnosis not present

## 2023-08-19 DIAGNOSIS — R269 Unspecified abnormalities of gait and mobility: Secondary | ICD-10-CM | POA: Diagnosis not present

## 2023-08-20 ENCOUNTER — Encounter: Payer: Self-pay | Admitting: Adult Health

## 2023-08-20 ENCOUNTER — Ambulatory Visit: Payer: Medicare HMO | Admitting: Adult Health

## 2023-09-07 DIAGNOSIS — J9610 Chronic respiratory failure, unspecified whether with hypoxia or hypercapnia: Secondary | ICD-10-CM | POA: Diagnosis not present

## 2023-09-07 DIAGNOSIS — J449 Chronic obstructive pulmonary disease, unspecified: Secondary | ICD-10-CM | POA: Diagnosis not present

## 2023-09-19 DIAGNOSIS — R269 Unspecified abnormalities of gait and mobility: Secondary | ICD-10-CM | POA: Diagnosis not present

## 2023-09-19 DIAGNOSIS — D860 Sarcoidosis of lung: Secondary | ICD-10-CM | POA: Diagnosis not present

## 2023-09-19 DIAGNOSIS — R062 Wheezing: Secondary | ICD-10-CM | POA: Diagnosis not present

## 2023-10-05 DIAGNOSIS — J449 Chronic obstructive pulmonary disease, unspecified: Secondary | ICD-10-CM | POA: Diagnosis not present

## 2023-10-05 DIAGNOSIS — J9610 Chronic respiratory failure, unspecified whether with hypoxia or hypercapnia: Secondary | ICD-10-CM | POA: Diagnosis not present

## 2023-10-17 DIAGNOSIS — R269 Unspecified abnormalities of gait and mobility: Secondary | ICD-10-CM | POA: Diagnosis not present

## 2023-10-17 DIAGNOSIS — D860 Sarcoidosis of lung: Secondary | ICD-10-CM | POA: Diagnosis not present

## 2023-10-17 DIAGNOSIS — R062 Wheezing: Secondary | ICD-10-CM | POA: Diagnosis not present

## 2023-11-05 DIAGNOSIS — J9610 Chronic respiratory failure, unspecified whether with hypoxia or hypercapnia: Secondary | ICD-10-CM | POA: Diagnosis not present

## 2023-11-05 DIAGNOSIS — J449 Chronic obstructive pulmonary disease, unspecified: Secondary | ICD-10-CM | POA: Diagnosis not present

## 2023-11-12 ENCOUNTER — Ambulatory Visit: Payer: Medicare HMO | Admitting: Nurse Practitioner

## 2023-11-12 ENCOUNTER — Encounter: Payer: Self-pay | Admitting: Nurse Practitioner

## 2023-11-17 DIAGNOSIS — R062 Wheezing: Secondary | ICD-10-CM | POA: Diagnosis not present

## 2023-11-17 DIAGNOSIS — D860 Sarcoidosis of lung: Secondary | ICD-10-CM | POA: Diagnosis not present

## 2023-11-17 DIAGNOSIS — R269 Unspecified abnormalities of gait and mobility: Secondary | ICD-10-CM | POA: Diagnosis not present

## 2023-12-05 DIAGNOSIS — J9610 Chronic respiratory failure, unspecified whether with hypoxia or hypercapnia: Secondary | ICD-10-CM | POA: Diagnosis not present

## 2023-12-05 DIAGNOSIS — J449 Chronic obstructive pulmonary disease, unspecified: Secondary | ICD-10-CM | POA: Diagnosis not present

## 2023-12-17 DIAGNOSIS — R062 Wheezing: Secondary | ICD-10-CM | POA: Diagnosis not present

## 2023-12-17 DIAGNOSIS — D860 Sarcoidosis of lung: Secondary | ICD-10-CM | POA: Diagnosis not present

## 2023-12-17 DIAGNOSIS — R269 Unspecified abnormalities of gait and mobility: Secondary | ICD-10-CM | POA: Diagnosis not present

## 2024-01-05 DIAGNOSIS — J9610 Chronic respiratory failure, unspecified whether with hypoxia or hypercapnia: Secondary | ICD-10-CM | POA: Diagnosis not present

## 2024-01-05 DIAGNOSIS — J449 Chronic obstructive pulmonary disease, unspecified: Secondary | ICD-10-CM | POA: Diagnosis not present

## 2024-01-05 DIAGNOSIS — G4733 Obstructive sleep apnea (adult) (pediatric): Secondary | ICD-10-CM | POA: Diagnosis not present

## 2024-01-17 DIAGNOSIS — D860 Sarcoidosis of lung: Secondary | ICD-10-CM | POA: Diagnosis not present

## 2024-01-17 DIAGNOSIS — R269 Unspecified abnormalities of gait and mobility: Secondary | ICD-10-CM | POA: Diagnosis not present

## 2024-01-17 DIAGNOSIS — R062 Wheezing: Secondary | ICD-10-CM | POA: Diagnosis not present

## 2024-02-04 DIAGNOSIS — J449 Chronic obstructive pulmonary disease, unspecified: Secondary | ICD-10-CM | POA: Diagnosis not present

## 2024-02-04 DIAGNOSIS — J9610 Chronic respiratory failure, unspecified whether with hypoxia or hypercapnia: Secondary | ICD-10-CM | POA: Diagnosis not present

## 2024-02-16 DIAGNOSIS — D860 Sarcoidosis of lung: Secondary | ICD-10-CM | POA: Diagnosis not present

## 2024-02-16 DIAGNOSIS — R062 Wheezing: Secondary | ICD-10-CM | POA: Diagnosis not present

## 2024-02-16 DIAGNOSIS — R269 Unspecified abnormalities of gait and mobility: Secondary | ICD-10-CM | POA: Diagnosis not present

## 2024-03-06 DIAGNOSIS — J9610 Chronic respiratory failure, unspecified whether with hypoxia or hypercapnia: Secondary | ICD-10-CM | POA: Diagnosis not present

## 2024-03-06 DIAGNOSIS — J449 Chronic obstructive pulmonary disease, unspecified: Secondary | ICD-10-CM | POA: Diagnosis not present

## 2024-03-08 DIAGNOSIS — H524 Presbyopia: Secondary | ICD-10-CM | POA: Diagnosis not present

## 2024-03-08 DIAGNOSIS — H5213 Myopia, bilateral: Secondary | ICD-10-CM | POA: Diagnosis not present

## 2024-03-08 DIAGNOSIS — H52223 Regular astigmatism, bilateral: Secondary | ICD-10-CM | POA: Diagnosis not present

## 2024-03-08 DIAGNOSIS — Z961 Presence of intraocular lens: Secondary | ICD-10-CM | POA: Diagnosis not present

## 2024-03-08 DIAGNOSIS — H04123 Dry eye syndrome of bilateral lacrimal glands: Secondary | ICD-10-CM | POA: Diagnosis not present

## 2024-03-18 DIAGNOSIS — D860 Sarcoidosis of lung: Secondary | ICD-10-CM | POA: Diagnosis not present

## 2024-03-18 DIAGNOSIS — R269 Unspecified abnormalities of gait and mobility: Secondary | ICD-10-CM | POA: Diagnosis not present

## 2024-03-18 DIAGNOSIS — R062 Wheezing: Secondary | ICD-10-CM | POA: Diagnosis not present

## 2024-04-06 DIAGNOSIS — J449 Chronic obstructive pulmonary disease, unspecified: Secondary | ICD-10-CM | POA: Diagnosis not present

## 2024-04-06 DIAGNOSIS — G4733 Obstructive sleep apnea (adult) (pediatric): Secondary | ICD-10-CM | POA: Diagnosis not present

## 2024-04-06 DIAGNOSIS — J9610 Chronic respiratory failure, unspecified whether with hypoxia or hypercapnia: Secondary | ICD-10-CM | POA: Diagnosis not present

## 2024-04-18 DIAGNOSIS — R062 Wheezing: Secondary | ICD-10-CM | POA: Diagnosis not present

## 2024-04-18 DIAGNOSIS — R269 Unspecified abnormalities of gait and mobility: Secondary | ICD-10-CM | POA: Diagnosis not present

## 2024-04-18 DIAGNOSIS — D860 Sarcoidosis of lung: Secondary | ICD-10-CM | POA: Diagnosis not present

## 2024-05-04 DIAGNOSIS — Z1331 Encounter for screening for depression: Secondary | ICD-10-CM | POA: Diagnosis not present

## 2024-05-04 DIAGNOSIS — E785 Hyperlipidemia, unspecified: Secondary | ICD-10-CM | POA: Diagnosis not present

## 2024-05-04 DIAGNOSIS — Z1389 Encounter for screening for other disorder: Secondary | ICD-10-CM | POA: Diagnosis not present

## 2024-05-04 DIAGNOSIS — Z23 Encounter for immunization: Secondary | ICD-10-CM | POA: Diagnosis not present

## 2024-05-04 DIAGNOSIS — Z6838 Body mass index (BMI) 38.0-38.9, adult: Secondary | ICD-10-CM | POA: Diagnosis not present

## 2024-05-04 DIAGNOSIS — G4733 Obstructive sleep apnea (adult) (pediatric): Secondary | ICD-10-CM | POA: Diagnosis not present

## 2024-05-04 DIAGNOSIS — Z0001 Encounter for general adult medical examination with abnormal findings: Secondary | ICD-10-CM | POA: Diagnosis not present

## 2024-05-04 DIAGNOSIS — I10 Essential (primary) hypertension: Secondary | ICD-10-CM | POA: Diagnosis not present

## 2024-05-04 DIAGNOSIS — N189 Chronic kidney disease, unspecified: Secondary | ICD-10-CM | POA: Diagnosis not present

## 2024-05-06 DIAGNOSIS — J449 Chronic obstructive pulmonary disease, unspecified: Secondary | ICD-10-CM | POA: Diagnosis not present

## 2024-05-06 DIAGNOSIS — J9610 Chronic respiratory failure, unspecified whether with hypoxia or hypercapnia: Secondary | ICD-10-CM | POA: Diagnosis not present

## 2024-05-18 DIAGNOSIS — R269 Unspecified abnormalities of gait and mobility: Secondary | ICD-10-CM | POA: Diagnosis not present

## 2024-05-18 DIAGNOSIS — D860 Sarcoidosis of lung: Secondary | ICD-10-CM | POA: Diagnosis not present

## 2024-05-18 DIAGNOSIS — R062 Wheezing: Secondary | ICD-10-CM | POA: Diagnosis not present

## 2024-06-06 DIAGNOSIS — J9610 Chronic respiratory failure, unspecified whether with hypoxia or hypercapnia: Secondary | ICD-10-CM | POA: Diagnosis not present

## 2024-06-06 DIAGNOSIS — J449 Chronic obstructive pulmonary disease, unspecified: Secondary | ICD-10-CM | POA: Diagnosis not present

## 2024-06-07 DIAGNOSIS — Z008 Encounter for other general examination: Secondary | ICD-10-CM | POA: Diagnosis not present

## 2024-06-18 DIAGNOSIS — R062 Wheezing: Secondary | ICD-10-CM | POA: Diagnosis not present

## 2024-06-18 DIAGNOSIS — D860 Sarcoidosis of lung: Secondary | ICD-10-CM | POA: Diagnosis not present

## 2024-07-06 DIAGNOSIS — J449 Chronic obstructive pulmonary disease, unspecified: Secondary | ICD-10-CM | POA: Diagnosis not present

## 2024-07-06 DIAGNOSIS — J9610 Chronic respiratory failure, unspecified whether with hypoxia or hypercapnia: Secondary | ICD-10-CM | POA: Diagnosis not present

## 2024-07-18 DIAGNOSIS — R269 Unspecified abnormalities of gait and mobility: Secondary | ICD-10-CM | POA: Diagnosis not present

## 2024-07-18 DIAGNOSIS — R062 Wheezing: Secondary | ICD-10-CM | POA: Diagnosis not present

## 2024-07-18 DIAGNOSIS — D860 Sarcoidosis of lung: Secondary | ICD-10-CM | POA: Diagnosis not present
